# Patient Record
Sex: Female | Born: 1975 | Race: White | Hispanic: No | Marital: Married
Health system: Southern US, Community
[De-identification: ages and names within clinical notes are randomized; demographics above are authoritative.]

## PROBLEM LIST (undated history)

## (undated) ENCOUNTER — Inpatient Hospital Stay (HOSPITAL_COMMUNITY): Payer: Self-pay

## (undated) DIAGNOSIS — O99280 Endocrine, nutritional and metabolic diseases complicating pregnancy, unspecified trimester: Secondary | ICD-10-CM

## (undated) DIAGNOSIS — R768 Other specified abnormal immunological findings in serum: Secondary | ICD-10-CM

## (undated) DIAGNOSIS — O09529 Supervision of elderly multigravida, unspecified trimester: Secondary | ICD-10-CM

## (undated) DIAGNOSIS — E7212 Methylenetetrahydrofolate reductase deficiency: Secondary | ICD-10-CM

## (undated) DIAGNOSIS — Z862 Personal history of diseases of the blood and blood-forming organs and certain disorders involving the immune mechanism: Secondary | ICD-10-CM

## (undated) DIAGNOSIS — Z8619 Personal history of other infectious and parasitic diseases: Secondary | ICD-10-CM

## (undated) DIAGNOSIS — R87629 Unspecified abnormal cytological findings in specimens from vagina: Secondary | ICD-10-CM

## (undated) DIAGNOSIS — Z87448 Personal history of other diseases of urinary system: Secondary | ICD-10-CM

## (undated) HISTORY — DX: Other specified abnormal immunological findings in serum: R76.8

## (undated) HISTORY — PX: COSMETIC SURGERY: SHX468

## (undated) HISTORY — DX: Personal history of other diseases of urinary system: Z87.448

## (undated) HISTORY — DX: Supervision of elderly multigravida, unspecified trimester: O09.529

## (undated) HISTORY — DX: Personal history of other infectious and parasitic diseases: Z86.19

## (undated) HISTORY — DX: Unspecified abnormal cytological findings in specimens from vagina: R87.629

## (undated) HISTORY — PX: COLPOSCOPY: SHX161

## (undated) HISTORY — DX: Personal history of diseases of the blood and blood-forming organs and certain disorders involving the immune mechanism: Z86.2

## (undated) HISTORY — PX: ECTOPIC PREGNANCY SURGERY: SHX613

---

## 1998-05-30 ENCOUNTER — Other Ambulatory Visit: Admission: RE | Admit: 1998-05-30 | Discharge: 1998-05-30 | Payer: Self-pay | Admitting: Obstetrics and Gynecology

## 1999-11-12 ENCOUNTER — Inpatient Hospital Stay (HOSPITAL_COMMUNITY): Admission: AD | Admit: 1999-11-12 | Discharge: 1999-11-12 | Payer: Self-pay | Admitting: *Deleted

## 1999-12-22 ENCOUNTER — Encounter: Payer: Self-pay | Admitting: Obstetrics and Gynecology

## 1999-12-22 ENCOUNTER — Encounter (INDEPENDENT_AMBULATORY_CARE_PROVIDER_SITE_OTHER): Payer: Self-pay | Admitting: *Deleted

## 1999-12-22 ENCOUNTER — Observation Stay (HOSPITAL_COMMUNITY): Admission: AD | Admit: 1999-12-22 | Discharge: 1999-12-22 | Payer: Self-pay | Admitting: Obstetrics and Gynecology

## 2008-02-11 ENCOUNTER — Other Ambulatory Visit: Admission: RE | Admit: 2008-02-11 | Discharge: 2008-02-11 | Payer: Self-pay | Admitting: Family Medicine

## 2008-02-16 ENCOUNTER — Ambulatory Visit: Payer: Self-pay | Admitting: Hematology & Oncology

## 2009-04-26 ENCOUNTER — Ambulatory Visit (HOSPITAL_COMMUNITY): Admission: RE | Admit: 2009-04-26 | Discharge: 2009-04-26 | Payer: Self-pay | Admitting: Obstetrics and Gynecology

## 2009-06-09 ENCOUNTER — Ambulatory Visit (HOSPITAL_COMMUNITY): Admission: RE | Admit: 2009-06-09 | Discharge: 2009-06-09 | Payer: Self-pay | Admitting: Obstetrics and Gynecology

## 2009-07-24 ENCOUNTER — Ambulatory Visit (HOSPITAL_COMMUNITY): Admission: RE | Admit: 2009-07-24 | Discharge: 2009-07-24 | Payer: Self-pay | Admitting: Obstetrics and Gynecology

## 2009-08-14 ENCOUNTER — Ambulatory Visit (HOSPITAL_COMMUNITY): Admission: RE | Admit: 2009-08-14 | Discharge: 2009-08-14 | Payer: Self-pay | Admitting: Obstetrics and Gynecology

## 2009-09-11 ENCOUNTER — Ambulatory Visit (HOSPITAL_COMMUNITY): Admission: RE | Admit: 2009-09-11 | Discharge: 2009-09-11 | Payer: Self-pay | Admitting: Obstetrics and Gynecology

## 2009-10-09 ENCOUNTER — Ambulatory Visit (HOSPITAL_COMMUNITY): Admission: RE | Admit: 2009-10-09 | Discharge: 2009-10-09 | Payer: Self-pay | Admitting: Obstetrics and Gynecology

## 2009-10-27 ENCOUNTER — Inpatient Hospital Stay (HOSPITAL_COMMUNITY): Admission: AD | Admit: 2009-10-27 | Discharge: 2009-10-29 | Payer: Self-pay | Admitting: Obstetrics and Gynecology

## 2011-03-27 LAB — CBC
HCT: 31.1 % — ABNORMAL LOW (ref 36.0–46.0)
HCT: 33.1 % — ABNORMAL LOW (ref 36.0–46.0)
MCV: 92.1 fL (ref 78.0–100.0)
MCV: 92.4 fL (ref 78.0–100.0)
Platelets: 260 10*3/uL (ref 150–400)
Platelets: 281 10*3/uL (ref 150–400)
RDW: 12.9 % (ref 11.5–15.5)
RDW: 13.3 % (ref 11.5–15.5)

## 2011-05-10 NOTE — Letter (Signed)
May 02, 2009    Arlyce Harman, M.D.  [address]  [place]  [place] [address]   RE:   Kemp, Sandra  MR#   [id number]  ACC#        [id number]   MFM CONSULTATION REPORT   Dear Dr. Neva Seat:   Thank you for referring your patient, Sandra Kemp, for maternal fetal  medicine consultation.  As you are aware, [name] is a 35 year old  gravida 5, para 3-0-1-3 at 12 weeks 4 days gestation based on her last  menstrual period and confirmed with early ultrasound.  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   [name] reports undergoing a workup for anticardiolipin antibodies  after she was found to have a false positive RPR with a previous  pregnancy.  She had no history of prior thromboembolic events for  pregnancy outcomes or symptoms of lupus, but given these findings was  treated in subsequent pregnancies with prophylactic dose heparin and  fractionated heparin and low-dose aspirin.  Since her diagnosis, Ms.  Sandra Kemp has followed annually with a rheumatologist.  She reports having  negative anticardiolipin antibody titers 1 year ago.  These were  reevaluated with her current pregnancy, and on April 18, 2009, lupus  anticoagulant, as well as anticardiolipin antibodies, were negative.   [name]' obstetric history includes three prior vaginal deliveries.  Her first pregnancy was delivered at term without complication.  It was  during this gestation that she was diagnosed with anticardiolipin  antibodies.  She subsequently had and an ectopic pregnancy.  She then  had two subsequent gestations, during which she received subcutaneous  injections of heparin and took low-dose aspirin.  One of these was  delivered at 35 weeks due to preeclampsia.  She reports some question  regarding the diagnosis of preeclampsia, but given clinical picture at  that time, delivery was recommended.  She delivered vaginally without  complication.  Subsequent pregnancy  was delivered at 52 weeks'  gestation, and she was told the indication for delivery at that  gestational age was her anticardiolipin antibodies.   [name] has been recommended to take a low-dose aspirin on a  continuous daily basis by her rheumatologist.  She is currently taking  this now and with prenatal vitamins.  She is on no other medications.   [name] GYN history is negative for abnormal Pap smears or STDs.  She  has had no procedures on her cervix or uterus.   [name] has no other significant medical history.  Her only surgery  is breast implants.  She reports no complications with this surgery.  [name] does not use tobacco, alcohol or street drugs.  She has no  significant family history including no thromboembolic conditions,  thromboembolic events or known genetic diseases or congenital anomalies.   [name] was feeling well today and had no specific complaints.  Her  review of systems was negative.   [name] underwent an ultrasound today which revealed a singleton  gestation with a gestational crown-rump length measurement consistent  with her last menstrual period.  The option of first semester screening  was discussed with [name] today, and she declined this.  Normal  fetal heart rate was documented, and no free fluid was seen in the cul-  de-sac.  No adnexal abnormalities were identified.   [name] blood pressure today was 117/67, her pulse was 85 beats per  minute, and her  weight was 146 pounds.  She appeared in no acute  distress, and her abdomen was appropriate without tenderness or masses.   The diagnosis of anticardiolipin antibodies was discussed with Ms.  Hughes at length including the variability in measurements of these  titers over time.  The potential for adverse maternal fetal outcomes  during pregnancy in the setting of anticardiolipin antibodies was also  discussed.  However, given that [name] had negative titers 1 year   ago, as well as negative titers on repeat evaluation during this  pregnancy, the risks for adverse pregnancy outcomes at the current time  are very low.  Additionally, given this, we would not recommend  treatment with heparin at the present time.  Given that her  rheumatologist has recommended long-term therapy with low-dose aspirin,  we have recommended that she continue this through gestation and  continue with her long-term therapy after delivery.  Given that antibody  titers can be variable during gestation, we would recommend repeat  evaluations of these in the second as well as the third trimester.  Recommendations may need to be revised based on the results of this  testing.  Would also recommend continued long-term follow-up with Ms.  [facility] rheumatologist.  Of note, she reports a complete thrombophilia  panel has been drawn in the past and found to be negative.  We would  recommend confirmation of this.   [name] has a history of preeclampsia.  Given this, she is increased  risk for development of preeclampsia with his gestation.  Of course,  close surveillance for development of preeclampsia will be necessary.  Further, we would recommend a baseline 24-hour urine be performed for  evaluation of baseline renal function.  We have scheduled [name] to  return to our office in approximately 6 weeks.  At that time, we will  perform a follow-up ultrasound to evaluate fetal anatomy and will redraw  her anticardiolipin antibodies at that time.   Risks and benefits of heparin therapy, as well as the risks and benefits  of anticardiolipin antibodies were reviewed at length with [name]  today.  Signs and symptoms of thromboembolic conditions were reviewed  and precautions were given.  We look forward to seeing [name] back.  Thank you for allowing [name] to participate in the care of [name].  Please feel free to contact us at any time regarding this or any patient  you may  have.   Of note, we now offer 24-hour direct access to maternal fetal medicine  faculty at [facility].  We can be reached at [phone]  at any time.  Please free to contact us via this number.   Sincerely,      Heather L. Rachel Bo, MD  Maternal Fetal Medicine  [facility]     HLM/MEDQ  D:  05/02/2009  T:  05/02/2009  Job:  [id number]

## 2013-09-09 LAB — OB RESULTS CONSOLE HIV ANTIBODY (ROUTINE TESTING): HIV: NONREACTIVE

## 2013-09-09 LAB — OB RESULTS CONSOLE HEPATITIS B SURFACE ANTIGEN: HEP B S AG: NEGATIVE

## 2013-09-09 LAB — OB RESULTS CONSOLE ABO/RH: RH TYPE: POSITIVE

## 2013-09-09 LAB — OB RESULTS CONSOLE RUBELLA ANTIBODY, IGM: Rubella: IMMUNE

## 2013-09-09 LAB — OB RESULTS CONSOLE ANTIBODY SCREEN: Antibody Screen: NEGATIVE

## 2013-12-23 NOTE — L&D Delivery Note (Signed)
Delivery Note At 3:21 PM a viable female was delivered via  LOA Presentation Apgars 9 9 weight pending  Placenta status:spontaneously, intact with 3 vessel cord , .  Cord:  with the following complications:none  Anesthesia: Epidural  Episiotomy: none Lacerations: none Suture Repair: not applicable Est. Blood Loss (mL): 300  Mom to postpartum.  Baby to Couplet care / Skin to Skin.  Jeani HawkingMichelle L Keajah Killough 04/05/2014, 3:30 PM

## 2014-02-26 ENCOUNTER — Inpatient Hospital Stay (HOSPITAL_COMMUNITY)
Admission: AD | Admit: 2014-02-26 | Discharge: 2014-02-26 | Disposition: A | Discharge status: Home / Self Care | Payer: No Typology Code available for payment source | Source: Ambulatory Visit | Attending: Obstetrics and Gynecology | Admitting: Obstetrics and Gynecology

## 2014-02-26 ENCOUNTER — Encounter (HOSPITAL_COMMUNITY): Payer: Self-pay | Admitting: *Deleted

## 2014-02-26 DIAGNOSIS — O99891 Other specified diseases and conditions complicating pregnancy: Secondary | ICD-10-CM | POA: Insufficient documentation

## 2014-02-26 DIAGNOSIS — M545 Low back pain, unspecified: Secondary | ICD-10-CM

## 2014-02-26 DIAGNOSIS — R109 Unspecified abdominal pain: Secondary | ICD-10-CM | POA: Insufficient documentation

## 2014-02-26 DIAGNOSIS — O9989 Other specified diseases and conditions complicating pregnancy, childbirth and the puerperium: Principal | ICD-10-CM

## 2014-02-26 DIAGNOSIS — R197 Diarrhea, unspecified: Secondary | ICD-10-CM | POA: Insufficient documentation

## 2014-02-26 HISTORY — DX: Methylenetetrahydrofolate reductase deficiency: O99.280

## 2014-02-26 HISTORY — DX: Methylenetetrahydrofolate reductase deficiency: E72.12

## 2014-02-26 LAB — COMPREHENSIVE METABOLIC PANEL
ALBUMIN: 3 g/dL — AB (ref 3.5–5.2)
ALT: 17 U/L (ref 0–35)
AST: 18 U/L (ref 0–37)
Alkaline Phosphatase: 130 U/L — ABNORMAL HIGH (ref 39–117)
BUN: 6 mg/dL (ref 6–23)
CALCIUM: 8.7 mg/dL (ref 8.4–10.5)
CO2: 21 mEq/L (ref 19–32)
Chloride: 102 mEq/L (ref 96–112)
Creatinine, Ser: 0.47 mg/dL — ABNORMAL LOW (ref 0.50–1.10)
GFR calc non Af Amer: >90 mL/min (ref >90)
GLUCOSE: 83 mg/dL (ref 70–99)
Potassium: 3.6 mEq/L — ABNORMAL LOW (ref 3.7–5.3)
SODIUM: 137 meq/L (ref 137–147)
TOTAL PROTEIN: 6.2 g/dL (ref 6.0–8.3)
Total Bilirubin: 0.6 mg/dL (ref 0.3–1.2)

## 2014-02-26 LAB — CBC WITH DIFFERENTIAL/PLATELET
BAND NEUTROPHILS: 0 % (ref 0–10)
BLASTS: 0 %
Basophils Absolute: 0 10*3/uL (ref 0.0–0.1)
Basophils Relative: 0 % (ref 0–1)
EOS ABS: 0.1 10*3/uL (ref 0.0–0.7)
EOS PCT: 1 % (ref 0–5)
HEMATOCRIT: 32.8 % — AB (ref 36.0–46.0)
HEMOGLOBIN: 11.6 g/dL — AB (ref 12.0–15.0)
LYMPHS ABS: 1 10*3/uL (ref 0.7–4.0)
LYMPHS PCT: 16 % (ref 12–46)
MCH: 30.9 pg (ref 26.0–34.0)
MCHC: 35.4 g/dL (ref 30.0–36.0)
MCV: 87.2 fL (ref 78.0–100.0)
METAMYELOCYTES PCT: 0 %
MONO ABS: 0.1 10*3/uL (ref 0.1–1.0)
MONOS PCT: 2 % — AB (ref 3–12)
Myelocytes: 0 %
NRBC: 0 /100{WBCs}
Neutro Abs: 5.1 10*3/uL (ref 1.7–7.7)
Neutrophils Relative %: 81 % — ABNORMAL HIGH (ref 43–77)
Platelets: 211 10*3/uL (ref 150–400)
Promyelocytes Absolute: 0 %
RBC: 3.76 MIL/uL — AB (ref 3.87–5.11)
RDW: 13 % (ref 11.5–15.5)
WBC: 6.3 10*3/uL (ref 4.0–10.5)

## 2014-02-26 LAB — URINALYSIS, ROUTINE W REFLEX MICROSCOPIC
Bilirubin Urine: NEGATIVE
GLUCOSE, UA: NEGATIVE mg/dL
HGB URINE DIPSTICK: NEGATIVE
Ketones, ur: 15 mg/dL — AB
Nitrite: NEGATIVE
PH: 6 (ref 5.0–8.0)
PROTEIN: NEGATIVE mg/dL
SPECIFIC GRAVITY, URINE: 1.01 (ref 1.005–1.030)
Urobilinogen, UA: 0.2 mg/dL (ref 0.0–1.0)

## 2014-02-26 LAB — URINE MICROSCOPIC-ADD ON

## 2014-02-26 MED ORDER — OXYCODONE-ACETAMINOPHEN 5-325 MG PO TABS
1.0000 | ORAL_TABLET | Freq: Once | ORAL | Status: AC
  Administered 2014-02-26: 1 via ORAL
  Filled 2014-02-26: qty 1

## 2014-02-26 MED ORDER — CYCLOBENZAPRINE HCL 10 MG PO TABS: 10.0000 mg | ORAL_TABLET | Freq: Three times a day (TID) | ORAL | Status: DC | PRN

## 2014-02-26 MED ORDER — OXYCODONE-ACETAMINOPHEN 5-325 MG PO TABS: 1.0000 | ORAL_TABLET | Freq: Four times a day (QID) | ORAL | Status: DC | PRN

## 2014-02-26 MED ORDER — CYCLOBENZAPRINE HCL 10 MG PO TABS
10.0000 mg | ORAL_TABLET | Freq: Once | ORAL | Status: AC
  Administered 2014-02-26: 10 mg via ORAL
  Filled 2014-02-26: qty 1

## 2014-02-26 NOTE — MAU Note (Signed)
Pt reports pain in since 3am. Pt states that the pain is mostly on the L side of her back and wraps around into her abdomen. Pt states that she has had chills, no appetite and just feels sick.

## 2014-02-26 NOTE — Discharge Instructions (Signed)
Abdominal Pain During Pregnancy °Abdominal pain is common in pregnancy. Most of the time, it does not cause harm. There are many causes of abdominal pain. Some causes are more serious than others. Some of the causes of abdominal pain in pregnancy are easily diagnosed. Occasionally, the diagnosis takes time to understand. Other times, the cause is not determined. Abdominal pain can be a sign that something is very wrong with the pregnancy, or the pain may have nothing to do with the pregnancy at all. For this reason, always tell your health care provider if you have any abdominal discomfort. °HOME CARE INSTRUCTIONS  °Monitor your abdominal pain for any changes. The following actions may help to alleviate any discomfort you are experiencing: °· Do not have sexual intercourse or put anything in your vagina until your symptoms go away completely. °· Get plenty of rest until your pain improves. °· Drink clear fluids if you feel nauseous. Avoid solid food as long as you are uncomfortable or nauseous. °· Only take over-the-counter or prescription medicine as directed by your health care provider. °· Keep all follow-up appointments with your health care provider. °SEEK IMMEDIATE MEDICAL CARE IF: °· You are bleeding, leaking fluid, or passing tissue from the vagina. °· You have increasing pain or cramping. °· You have persistent vomiting. °· You have painful or bloody urination. °· You have a fever. °· You notice a decrease in your baby's movements. °· You have extreme weakness or feel faint. °· You have shortness of breath, with or without abdominal pain. °· You develop a severe headache with abdominal pain. °· You have abnormal vaginal discharge with abdominal pain. °· You have persistent diarrhea. °· You have abdominal pain that continues even after rest, or gets worse. °MAKE SURE YOU:  °· Understand these instructions. °· Will watch your condition. °· Will get help right away if you are not doing well or get  worse. °Document Released: 12/09/2005 Document Revised: 09/29/2013 Document Reviewed: 07/08/2013 °ExitCare® Patient Information ©2014 [employer]. ° °

## 2014-02-26 NOTE — MAU Note (Deleted)
Pt reports her water broke 1hr ago. Clear fluid. Clear fluid observedd leaking out at this time. Pt uncomfortable SVE 4-5/70-0

## 2014-02-26 NOTE — MAU Provider Note (Signed)
  History     CSN: [id number]  Arrival date and time: 02/26/14 [id number]   First Provider Initiated Contact with Patient 02/26/14 1849      Chief Complaint  Patient presents with  . Back Pain  . Abdominal Cramping   Back Pain  Abdominal Cramping    38 yo [id number] @ [id number] here for left sided flank pain wrapping around to her abdomen.  States that she woke up at 3am with this burning and sharp pain in the left lower back.  Feels like it wraps on her left side around to the front but stays only on that left side.  Has also had nausea and 3 episodes of diarrhea.  No vomiting, fevers, chills, dysuria, hematuria, increased frequency.     No sob, cp.     OB History   Grav Para Term Preterm Abortions TAB SAB Ect Mult Living   7 4 3 1 2  1 1  2     G1- NSVD, preterm @35  weeks induction for pre-E G2-G4- term, NSVD G6- SAB G7- current  Past Medical History  Diagnosis Date  . MTHFR deficiency complicating pregnancy     Past Surgical History  Procedure Laterality Date  . Ectopic pregnancy surgery      Family History  Problem Relation Age of Onset  . Diabetes Father     History  Substance Use Topics  . Smoking status: Never Smoker   . Smokeless tobacco: Not on file  . Alcohol Use: No    Allergies: No Known Allergies  Prescriptions prior to admission  Medication Sig Dispense Refill  . aspirin 81 MG chewable tablet Chew by mouth daily.      . folic acid (FOLVITE) 400 MCG tablet Take 400 mcg by mouth daily.        Review of Systems  Musculoskeletal: Positive for back pain.  - see above Physical Exam   Blood pressure 124/71, pulse 99, temperature 98.2 F (36.8 C), temperature source Oral, resp. rate 16.  Physical Exam  Constitutional: She is oriented to person, place, and time. She appears well-developed and well-nourished.  Neck: Neck supple.  Cardiovascular: Normal rate, regular rhythm and normal heart sounds.   No murmur heard. Respiratory: Effort normal and  breath sounds normal. No respiratory distress.  GI: Soft. Bowel sounds are normal. She exhibits no distension and no mass. There is tenderness (mild tenderness at the epigastrium ). There is no rebound and no guarding.  Musculoskeletal: Normal range of motion.  No CVA tenderness.  Mild ttp along the paraspinals on the left.   Neurological: She is alert and oriented to person, place, and time.  Skin: Skin is warm and dry. No rash noted.    MAU Course  Procedures  MDM Unclear etiology. Exam benign but pain apperas somewhat severe initially.  Ddx includes viral illness vs. msk etiology vs. Nephrolithiasis Cbc and cmp unremarkable.  Given percocet x 1 here with marked improvement in her pain making nephrolithiasis less likely.    Assessment and Plan    - no red flag symptoms and exam benign.  - will send home with percocet and flexeril -discussed leading diagnoses with pt as well - discussed with Dr. Rana SnareLowe who was in agreement - will have pt f/u in the office on Tuesday as scheduled. - return precautions including worsening pain, change in symptoms, or other changes discussed.   Tuvia Woodrick L 02/26/2014, 7:01 PM

## 2014-02-28 LAB — URINE CULTURE
Colony Count: NO GROWTH
Culture: NO GROWTH
Special Requests: NORMAL

## 2014-03-31 ENCOUNTER — Telehealth (HOSPITAL_COMMUNITY): Payer: Self-pay | Admitting: *Deleted

## 2014-03-31 ENCOUNTER — Encounter (HOSPITAL_COMMUNITY): Payer: Self-pay | Admitting: *Deleted

## 2014-03-31 LAB — OB RESULTS CONSOLE GBS: STREP GROUP B AG: POSITIVE

## 2014-03-31 NOTE — Telephone Encounter (Signed)
Preadmission screen  

## 2014-04-01 ENCOUNTER — Encounter (HOSPITAL_COMMUNITY): Payer: Self-pay | Admitting: *Deleted

## 2014-04-01 ENCOUNTER — Telehealth (HOSPITAL_COMMUNITY): Payer: Self-pay | Admitting: *Deleted

## 2014-04-01 NOTE — Telephone Encounter (Signed)
Preadmission screen  

## 2014-04-05 ENCOUNTER — Inpatient Hospital Stay (HOSPITAL_COMMUNITY): Payer: No Typology Code available for payment source | Admitting: Anesthesiology

## 2014-04-05 ENCOUNTER — Encounter (HOSPITAL_COMMUNITY): Payer: No Typology Code available for payment source | Admitting: Anesthesiology

## 2014-04-05 ENCOUNTER — Encounter (HOSPITAL_COMMUNITY): Payer: Self-pay

## 2014-04-05 ENCOUNTER — Inpatient Hospital Stay (HOSPITAL_COMMUNITY)
Admission: RE | Admit: 2014-04-05 | Discharge: 2014-04-06 | DRG: 775 | Disposition: A | Discharge status: Home / Self Care | Payer: No Typology Code available for payment source | Source: Ambulatory Visit | Attending: Obstetrics and Gynecology | Admitting: Obstetrics and Gynecology

## 2014-04-05 DIAGNOSIS — Z833 Family history of diabetes mellitus: Secondary | ICD-10-CM

## 2014-04-05 DIAGNOSIS — E7210 Disorders of sulfur-bearing amino-acid metabolism, unspecified: Secondary | ICD-10-CM | POA: Diagnosis present

## 2014-04-05 DIAGNOSIS — Z3490 Encounter for supervision of normal pregnancy, unspecified, unspecified trimester: Secondary | ICD-10-CM

## 2014-04-05 DIAGNOSIS — O09529 Supervision of elderly multigravida, unspecified trimester: Secondary | ICD-10-CM | POA: Diagnosis present

## 2014-04-05 DIAGNOSIS — O99892 Other specified diseases and conditions complicating childbirth: Principal | ICD-10-CM | POA: Diagnosis present

## 2014-04-05 DIAGNOSIS — O9989 Other specified diseases and conditions complicating pregnancy, childbirth and the puerperium: Principal | ICD-10-CM

## 2014-04-05 DIAGNOSIS — Z8249 Family history of ischemic heart disease and other diseases of the circulatory system: Secondary | ICD-10-CM

## 2014-04-05 DIAGNOSIS — Z22330 Carrier of Group B streptococcus: Secondary | ICD-10-CM

## 2014-04-05 LAB — CBC
HCT: 34 % — ABNORMAL LOW (ref 36.0–46.0)
Hemoglobin: 11.9 g/dL — ABNORMAL LOW (ref 12.0–15.0)
MCH: 31.1 pg (ref 26.0–34.0)
MCHC: 35 g/dL (ref 30.0–36.0)
MCV: 88.8 fL (ref 78.0–100.0)
PLATELETS: 262 10*3/uL (ref 150–400)
RBC: 3.83 MIL/uL — ABNORMAL LOW (ref 3.87–5.11)
RDW: 13.3 % (ref 11.5–15.5)
WBC: 7.2 10*3/uL (ref 4.0–10.5)

## 2014-04-05 LAB — RPR: RPR: REACTIVE — AB

## 2014-04-05 LAB — RPR TITER: RPR Titer: 1:4 {titer} — AB

## 2014-04-05 LAB — TYPE AND SCREEN
ABO/RH(D): A POS
Antibody Screen: NEGATIVE

## 2014-04-05 LAB — ABO/RH: ABO/RH(D): A POS

## 2014-04-05 MED ORDER — DEXTROSE 5 % IV SOLN
5.0000 10*6.[IU] | Freq: Once | INTRAVENOUS | Status: AC
  Administered 2014-04-05: 5 10*6.[IU] via INTRAVENOUS
  Filled 2014-04-05: qty 5

## 2014-04-05 MED ORDER — TETANUS-DIPHTH-ACELL PERTUSSIS 5-2.5-18.5 LF-MCG/0.5 IM SUSP: 0.5000 mL | Freq: Once | INTRAMUSCULAR | Status: DC

## 2014-04-05 MED ORDER — PHENYLEPHRINE 40 MCG/ML (10ML) SYRINGE FOR IV PUSH (FOR BLOOD PRESSURE SUPPORT)
80.0000 ug | PREFILLED_SYRINGE | INTRAVENOUS | Status: DC | PRN
  Filled 2014-04-05: qty 2
  Filled 2014-04-05: qty 10

## 2014-04-05 MED ORDER — LACTATED RINGERS IV SOLN
INTRAVENOUS | Status: DC
  Administered 2014-04-05 (×2): via INTRAVENOUS

## 2014-04-05 MED ORDER — BENZOCAINE-MENTHOL 20-0.5 % EX AERO: 1.0000 "application " | INHALATION_SPRAY | CUTANEOUS | Status: DC | PRN

## 2014-04-05 MED ORDER — LIDOCAINE HCL (PF) 1 % IJ SOLN
INTRAMUSCULAR | Status: DC | PRN
  Administered 2014-04-05 (×2): 5 mL

## 2014-04-05 MED ORDER — MEASLES, MUMPS & RUBELLA VAC ~~LOC~~ INJ: 0.5000 mL | INJECTION | Freq: Once | SUBCUTANEOUS | Status: DC

## 2014-04-05 MED ORDER — LACTATED RINGERS IV SOLN: 500.0000 mL | Freq: Once | INTRAVENOUS | Status: DC

## 2014-04-05 MED ORDER — ONDANSETRON HCL 4 MG PO TABS: 4.0000 mg | ORAL_TABLET | ORAL | Status: DC | PRN

## 2014-04-05 MED ORDER — ONDANSETRON HCL 4 MG/2ML IJ SOLN: 4.0000 mg | Freq: Four times a day (QID) | INTRAMUSCULAR | Status: DC | PRN

## 2014-04-05 MED ORDER — EPHEDRINE 5 MG/ML INJ
10.0000 mg | INTRAVENOUS | Status: DC | PRN
  Filled 2014-04-05: qty 4
  Filled 2014-04-05: qty 2

## 2014-04-05 MED ORDER — DIPHENHYDRAMINE HCL 50 MG/ML IJ SOLN: 12.5000 mg | INTRAMUSCULAR | Status: DC | PRN

## 2014-04-05 MED ORDER — CITRIC ACID-SODIUM CITRATE 334-500 MG/5ML PO SOLN: 30.0000 mL | ORAL | Status: DC | PRN

## 2014-04-05 MED ORDER — LIDOCAINE HCL (PF) 1 % IJ SOLN
30.0000 mL | INTRAMUSCULAR | Status: DC | PRN
  Filled 2014-04-05: qty 30

## 2014-04-05 MED ORDER — LACTATED RINGERS IV SOLN
500.0000 mL | INTRAVENOUS | Status: DC | PRN
Start: 2014-04-05 — End: 2014-04-05

## 2014-04-05 MED ORDER — SENNOSIDES-DOCUSATE SODIUM 8.6-50 MG PO TABS
2.0000 | ORAL_TABLET | ORAL | Status: DC
  Administered 2014-04-06: 2 via ORAL
  Filled 2014-04-05: qty 2

## 2014-04-05 MED ORDER — PRENATAL MULTIVITAMIN CH
1.0000 | ORAL_TABLET | Freq: Every day | ORAL | Status: DC
  Administered 2014-04-06: 1 via ORAL
  Filled 2014-04-05: qty 1

## 2014-04-05 MED ORDER — WITCH HAZEL-GLYCERIN EX PADS: 1.0000 "application " | MEDICATED_PAD | CUTANEOUS | Status: DC | PRN

## 2014-04-05 MED ORDER — PENICILLIN G POTASSIUM 5000000 UNITS IJ SOLR
2.5000 10*6.[IU] | INTRAMUSCULAR | Status: DC
  Administered 2014-04-05: 2.5 10*6.[IU] via INTRAVENOUS
  Filled 2014-04-05 (×5): qty 2.5

## 2014-04-05 MED ORDER — OXYCODONE-ACETAMINOPHEN 5-325 MG PO TABS
1.0000 | ORAL_TABLET | ORAL | Status: DC | PRN
  Administered 2014-04-05: 1 via ORAL
  Administered 2014-04-06 (×4): 2 via ORAL
  Filled 2014-04-05: qty 2
  Filled 2014-04-05: qty 1
  Filled 2014-04-05 (×3): qty 2

## 2014-04-05 MED ORDER — IBUPROFEN 600 MG PO TABS
600.0000 mg | ORAL_TABLET | Freq: Four times a day (QID) | ORAL | Status: DC
  Administered 2014-04-05 – 2014-04-06 (×4): 600 mg via ORAL
  Filled 2014-04-05 (×4): qty 1

## 2014-04-05 MED ORDER — EPHEDRINE 5 MG/ML INJ
10.0000 mg | INTRAVENOUS | Status: DC | PRN
  Filled 2014-04-05: qty 2

## 2014-04-05 MED ORDER — LANOLIN HYDROUS EX OINT: TOPICAL_OINTMENT | CUTANEOUS | Status: DC | PRN

## 2014-04-05 MED ORDER — FENTANYL 2.5 MCG/ML BUPIVACAINE 1/10 % EPIDURAL INFUSION (WH - ANES)
14.0000 mL/h | INTRAMUSCULAR | Status: DC | PRN
Start: 2014-04-05 — End: 2014-04-05
  Administered 2014-04-05: 14 mL/h via EPIDURAL
  Filled 2014-04-05: qty 125

## 2014-04-05 MED ORDER — MEDROXYPROGESTERONE ACETATE 150 MG/ML IM SUSP: 150.0000 mg | INTRAMUSCULAR | Status: DC | PRN

## 2014-04-05 MED ORDER — IBUPROFEN 600 MG PO TABS: 600.0000 mg | ORAL_TABLET | Freq: Four times a day (QID) | ORAL | Status: DC | PRN

## 2014-04-05 MED ORDER — FLEET ENEMA 7-19 GM/118ML RE ENEM: 1.0000 | ENEMA | Freq: Every day | RECTAL | Status: DC | PRN

## 2014-04-05 MED ORDER — OXYCODONE-ACETAMINOPHEN 5-325 MG PO TABS
1.0000 | ORAL_TABLET | ORAL | Status: DC | PRN
  Administered 2014-04-05: 1 via ORAL
  Filled 2014-04-05: qty 1

## 2014-04-05 MED ORDER — OXYTOCIN 40 UNITS IN LACTATED RINGERS INFUSION - SIMPLE MED
62.5000 mL/h | INTRAVENOUS | Status: DC
  Administered 2014-04-05: 62.5 mL/h via INTRAVENOUS

## 2014-04-05 MED ORDER — OXYTOCIN BOLUS FROM INFUSION: 500.0000 mL | INTRAVENOUS | Status: DC

## 2014-04-05 MED ORDER — OXYTOCIN 40 UNITS IN LACTATED RINGERS INFUSION - SIMPLE MED
1.0000 m[IU]/min | INTRAVENOUS | Status: DC
  Administered 2014-04-05: 1 m[IU]/min via INTRAVENOUS
  Filled 2014-04-05: qty 1000

## 2014-04-05 MED ORDER — PHENYLEPHRINE 40 MCG/ML (10ML) SYRINGE FOR IV PUSH (FOR BLOOD PRESSURE SUPPORT)
80.0000 ug | PREFILLED_SYRINGE | INTRAVENOUS | Status: DC | PRN
  Filled 2014-04-05: qty 2

## 2014-04-05 MED ORDER — BISACODYL 10 MG RE SUPP
10.0000 mg | Freq: Every day | RECTAL | Status: DC | PRN
Start: 2014-04-05 — End: 2014-04-06

## 2014-04-05 MED ORDER — ACETAMINOPHEN 325 MG PO TABS: 650.0000 mg | ORAL_TABLET | ORAL | Status: DC | PRN

## 2014-04-05 MED ORDER — TERBUTALINE SULFATE 1 MG/ML IJ SOLN: 0.2500 mg | Freq: Once | INTRAMUSCULAR | Status: DC | PRN

## 2014-04-05 MED ORDER — DIBUCAINE 1 % RE OINT: 1.0000 "application " | TOPICAL_OINTMENT | RECTAL | Status: DC | PRN

## 2014-04-05 MED ORDER — DIPHENHYDRAMINE HCL 25 MG PO CAPS: 25.0000 mg | ORAL_CAPSULE | Freq: Four times a day (QID) | ORAL | Status: DC | PRN

## 2014-04-05 MED ORDER — SIMETHICONE 80 MG PO CHEW: 80.0000 mg | CHEWABLE_TABLET | ORAL | Status: DC | PRN

## 2014-04-05 MED ORDER — ONDANSETRON HCL 4 MG/2ML IJ SOLN
4.0000 mg | INTRAMUSCULAR | Status: DC | PRN
Start: 2014-04-05 — End: 2014-04-06

## 2014-04-05 MED ORDER — ZOLPIDEM TARTRATE 5 MG PO TABS: 5.0000 mg | ORAL_TABLET | Freq: Every evening | ORAL | Status: DC | PRN

## 2014-04-05 NOTE — H&P (Signed)
Sandra Kemp is a 38 y.o. female presenting for induction of labor. She has history of positive ANA. Induction per patient - history of rapid labor and positive GBBS . Maternal Medical History:  Contractions: Frequency: irregular.   Perceived severity is mild.    Fetal activity: Perceived fetal activity is normal.      OB History   Grav Para Term Preterm Abortions TAB SAB Ect Mult Living   7 4 3 1 2  1 1  4      Past Medical History  Diagnosis Date  . MTHFR deficiency complicating pregnancy   . ANA positive   . Vaginal Pap smear, abnormal   . Hx of varicella   . Hx of pyelonephritis   . AMA (advanced maternal age) multigravida 35+    Past Surgical History  Procedure Laterality Date  . Ectopic pregnancy surgery    . Colposcopy     Family History: family history includes Diabetes in her father; Hypertension in her father. Social History:  reports that she has never smoked. She has never used smokeless tobacco. She reports that she does not drink alcohol or use illicit drugs.   Prenatal Transfer Tool  Maternal Diabetes: No Genetic Screening: Normal Maternal Ultrasounds/Referrals: Normal Fetal Ultrasounds or other Referrals:  None Maternal Substance Abuse:  No Significant Maternal Medications:  None Significant Maternal Lab Results:  None Other Comments:  None  Review of Systems  All other systems reviewed and are negative.   Dilation: 3 Effacement (%): Thick Station: -2 Exam by:: Yehoshua Vitelli Blood pressure 119/78, pulse 111, temperature 98.2 F (36.8 C), temperature source Oral, resp. rate 20, height 5\' 11"  (1.803 m), weight 77.111 kg (170 lb). Maternal Exam:  Uterine Assessment: Contraction strength is mild.  Contraction frequency is irregular.   Abdomen: Fetal presentation: vertex  Introitus: Normal vulva. Normal vagina.    Fetal Exam Fetal State Assessment: Category I - tracings are normal.     Physical Exam  Nursing note and vitals  reviewed. Constitutional: She appears well-developed.  HENT:  Head: Normocephalic.  Eyes: Pupils are equal, round, and reactive to light.  Cardiovascular: Normal rate.   Respiratory: Effort normal.  GI: Soft.    Prenatal labs: ABO, Rh: A/Positive/-- (09/18 0000) Antibody: Negative (09/18 0000) Rubella: Immune (09/18 0000) RPR:    HBsAg: Negative (09/18 0000)  HIV: Non-reactive (09/18 0000)  GBS: Positive (04/09 0000)   Assessment/Plan: IUP at term Favorable cervix  Positive GBBS  Start penicillin Possible Pitocin Risks reviewed Epidural ok   Sandra Kemp 04/05/2014, 8:14 AM

## 2014-04-05 NOTE — Anesthesia Procedure Notes (Signed)
Epidural Patient location during procedure: OB Start time: 04/05/2014 10:24 AM  Staffing Anesthesiologist: Brayton CavesJACKSON, Barron Vanloan Performed by: anesthesiologist   Preanesthetic Checklist Completed: patient identified, site marked, surgical consent, pre-op evaluation, timeout performed, IV checked, risks and benefits discussed and monitors and equipment checked  Epidural Patient position: sitting Prep: site prepped and draped and DuraPrep Patient monitoring: continuous pulse ox and blood pressure Approach: midline Injection technique: LOR air  Needle:  Needle type: Tuohy  Needle gauge: 17 G Needle length: 9 cm and 9 Needle insertion depth: 4 cm Catheter type: closed end flexible Catheter size: 19 Gauge Catheter at skin depth: 10 cm Test dose: negative  Assessment Events: blood not aspirated, injection not painful, no injection resistance, negative IV test and no paresthesia  Additional Notes Patient identified.  Risk benefits discussed including failed block, incomplete pain control, headache, nerve damage, paralysis, blood pressure changes, nausea, vomiting, reactions to medication both toxic or allergic, and postpartum back pain.  Patient expressed understanding and wished to proceed.  All questions were answered.  Sterile technique used throughout procedure and epidural site dressed with sterile barrier dressing. No paresthesia or other complications noted.The patient did not experience any signs of intravascular injection such as tinnitus or metallic taste in mouth nor signs of intrathecal spread such as rapid motor block. Please see nursing notes for vital signs.

## 2014-04-05 NOTE — Progress Notes (Signed)
CRITICAL VALUE ALERT  Critical value received:  RPR reactive Titer 1.4  Date of notification: 04/05/14  Time of notification:  1515  Critical value read back:yes  Nurse who received alert:  L. Cresenzo,RN  MD notified (1st page):  Dr Vincente PoliGrewal   Time of first page:  1515 at bedside  MD notified (2nd page):  Time of second page:  Responding MD:  Dr Vincente PoliGrewal  Time MD responded:  [phone]

## 2014-04-05 NOTE — Anesthesia Preprocedure Evaluation (Addendum)
Anesthesia Evaluation  Patient identified by MRN, date of birth, ID band Patient awake    Reviewed: Allergy & Precautions, H&P , Patient's Chart, lab work & pertinent test results  Airway Mallampati: II TM Distance: >3 FB Neck ROM: full    Dental   Pulmonary  breath sounds clear to auscultation        Cardiovascular Rhythm:regular Rate:Normal     Neuro/Psych    GI/Hepatic   Endo/Other    Renal/GU      Musculoskeletal   Abdominal   Peds  Hematology   Anesthesia Other Findings ANA positive MTHFR deficiency  Reproductive/Obstetrics (+) Pregnancy                           Anesthesia Physical Anesthesia Plan  ASA: II  Anesthesia Plan: Epidural   Post-op Pain Management:    Induction:   Airway Management Planned:   Additional Equipment:   Intra-op Plan:   Post-operative Plan:   Informed Consent: I have reviewed the patients History and Physical, chart, labs and discussed the procedure including the risks, benefits and alternatives for the proposed anesthesia with the patient or authorized representative who has indicated his/her understanding and acceptance.     Plan Discussed with:   Anesthesia Plan Comments:        Anesthesia Quick Evaluation

## 2014-04-06 LAB — RUBELLA SCREEN: RUBELLA: 1.32 {index} — AB (ref <0.90)

## 2014-04-06 LAB — CBC
HCT: 28.2 % — ABNORMAL LOW (ref 36.0–46.0)
Hemoglobin: 9.6 g/dL — ABNORMAL LOW (ref 12.0–15.0)
MCH: 30.4 pg (ref 26.0–34.0)
MCHC: 34 g/dL (ref 30.0–36.0)
MCV: 89.2 fL (ref 78.0–100.0)
Platelets: 184 10*3/uL (ref 150–400)
RBC: 3.16 MIL/uL — ABNORMAL LOW (ref 3.87–5.11)
RDW: 13.2 % (ref 11.5–15.5)
WBC: 9.3 10*3/uL (ref 4.0–10.5)

## 2014-04-06 LAB — T.PALLIDUM AB, IGG: T pallidum Antibodies (TP-PA): 0.14 S/CO (ref <0.90)

## 2014-04-06 MED ORDER — IBUPROFEN 600 MG PO TABS: 600.0000 mg | ORAL_TABLET | Freq: Four times a day (QID) | ORAL | Status: DC

## 2014-04-06 MED ORDER — OXYCODONE-ACETAMINOPHEN 5-325 MG PO TABS: 1.0000 | ORAL_TABLET | ORAL | Status: DC | PRN

## 2014-04-06 NOTE — Discharge Summary (Signed)
Obstetric Discharge Summary Reason for Admission: induction of labor Prenatal Procedures: ultrasound Intrapartum Procedures: spontaneous vaginal delivery Postpartum Procedures: none Complications-Operative and Postpartum: none Hemoglobin  Date Value Ref Range Status  04/06/2014 9.6* 12.0 - 15.0 g/dL Final     DELTA CHECK NOTED     REPEATED TO VERIFY     HCT  Date Value Ref Range Status  04/06/2014 28.2* 36.0 - 46.0 % Final    Physical Exam:  General: alert and cooperative Lochia: appropriate Uterine Fundus: firm Incision: perineum intact DVT Evaluation: No evidence of DVT seen on physical exam. Negative Homan's sign. No cords or calf tenderness. No significant calf/ankle edema.  Discharge Diagnoses: Term Pregnancy-delivered  Discharge Information: Date: 04/06/2014 Activity: pelvic rest Diet: routine Medications: PNV, Ibuprofen and Percocet Condition: stable Instructions: refer to practice specific booklet Discharge to: home   Newborn Data: Live born female  Birth Weight: 6 lb 2.9 oz (2805 g) APGAR: 9, 9  Home with mother.  Judith BlonderCarol G Shailee Foots 04/06/2014, 8:15 AM

## 2014-04-06 NOTE — Anesthesia Postprocedure Evaluation (Signed)
Anesthesia Post Note  Patient: Sandra Kemp  Procedure(s) Performed: * No procedures listed *  Anesthesia type: Epidural  Patient location: Mother/Baby  Post pain: Pain level controlled  Post assessment: Post-op Vital signs reviewed  Last Vitals:  Filed Vitals:   04/06/14 0610  BP: 124/80  Pulse: 77  Temp: 36.4 C  Resp: 20    Post vital signs: Reviewed  Level of consciousness: awake  Complications: No apparent anesthesia complications

## 2014-04-06 NOTE — Lactation Note (Addendum)
This note was copied from the chart of Sandra Mliss Saxnna Ethridge. Lactation Consultation Note Initial consult: P5, States BF for 1-2 weeks with each child. Hand expression taught to Mom.  Mother placed baby in football hold.  Demonstrated breast compression to achieve a deep latch. Mom reports increased comfort.  Baby is nursing very well. Mom encouraged to waken baby for feeds q3. Specifics provided regarding small baby feeding patterns.  Reviewed basics.  Mom made aware of O/P services, breastfeeding support groups, community resources, and our phone # for post-discharge questions.  Encouraged mother to post pump and give back baby volume.  Mother is Engineer, structuralcalling insurance company to get DEBP. Mother is [facility].  Encouraged a LC outpt appt with them.     Patient Name: Sandra Kemp Reason for consult: Initial assessment   Maternal Data Infant to breast within first hour of birth: Yes Has patient been taught Hand Expression?: Yes Does the patient have breastfeeding experience prior to this delivery?: Yes  Feeding Feeding Type: Breast Fed  LATCH Score/Interventions Latch: Grasps breast easily, tongue down, lips flanged, rhythmical sucking.  Audible Swallowing: A few with stimulation  Type of Nipple: Everted at rest and after stimulation  Comfort (Breast/Nipple): Soft / non-tender     Hold (Positioning): Assistance needed to correctly position infant at breast and maintain latch.  LATCH Score: 8  Lactation Tools Discussed/Used     Consult Status Consult Status: Follow-up Date: 04/07/14 Follow-up type: In-patient    Sandra Kemp Kemp, 11:38 AM

## 2014-10-24 ENCOUNTER — Encounter (HOSPITAL_COMMUNITY): Payer: Self-pay

## 2016-04-05 ENCOUNTER — Ambulatory Visit (INDEPENDENT_AMBULATORY_CARE_PROVIDER_SITE_OTHER): Payer: No Typology Code available for payment source | Admitting: Family Medicine

## 2016-04-05 ENCOUNTER — Other Ambulatory Visit: Payer: Self-pay | Admitting: Family Medicine

## 2016-04-05 VITALS — BP 110/70 | HR 84 | Temp 97.5°F | Resp 17 | Ht 70.0 in | Wt 133.0 lb

## 2016-04-05 DIAGNOSIS — N3001 Acute cystitis with hematuria: Secondary | ICD-10-CM | POA: Diagnosis not present

## 2016-04-05 DIAGNOSIS — Z209 Contact with and (suspected) exposure to unspecified communicable disease: Secondary | ICD-10-CM | POA: Diagnosis not present

## 2016-04-05 DIAGNOSIS — J029 Acute pharyngitis, unspecified: Secondary | ICD-10-CM | POA: Diagnosis not present

## 2016-04-05 DIAGNOSIS — R1032 Left lower quadrant pain: Secondary | ICD-10-CM

## 2016-04-05 LAB — POCT CBC
GRANULOCYTE PERCENT: 68 % (ref 37–80)
HEMATOCRIT: 41.1 % (ref 37.7–47.9)
Hemoglobin: 14.3 g/dL (ref 12.2–16.2)
Lymph, poc: 2.1 (ref 0.6–3.4)
MCH: 30.6 pg (ref 27–31.2)
MCHC: 34.7 g/dL (ref 31.8–35.4)
MCV: 88.1 fL (ref 80–97)
MID (CBC): 0.4 (ref 0–0.9)
MPV: 7.2 fL (ref 0–99.8)
POC GRANULOCYTE: 5.4 (ref 2–6.9)
POC LYMPH %: 26.5 % (ref 10–50)
POC MID %: 5.5 %M (ref 0–12)
Platelet Count, POC: 312 10*3/uL (ref 142–424)
RBC: 4.66 M/uL (ref 4.04–5.48)
RDW, POC: 12.7 %
WBC: 7.9 10*3/uL (ref 4.6–10.2)

## 2016-04-05 LAB — POCT URINALYSIS DIP (MANUAL ENTRY)
BILIRUBIN UA: NEGATIVE
Bilirubin, UA: NEGATIVE
Glucose, UA: NEGATIVE
Nitrite, UA: POSITIVE — AB
Protein Ur, POC: NEGATIVE
SPEC GRAV UA: 1.01
Urobilinogen, UA: 0.2
pH, UA: 6

## 2016-04-05 LAB — POC MICROSCOPIC URINALYSIS (UMFC)

## 2016-04-05 MED ORDER — PHENAZOPYRIDINE HCL 200 MG PO TABS: 200.0000 mg | ORAL_TABLET | Freq: Three times a day (TID) | ORAL | Status: DC | PRN

## 2016-04-05 MED ORDER — SULFAMETHOXAZOLE-TRIMETHOPRIM 800-160 MG PO TABS: 1.0000 | ORAL_TABLET | Freq: Two times a day (BID) | ORAL | Status: DC

## 2016-04-05 MED ORDER — HYDROCODONE-ACETAMINOPHEN 5-325 MG PO TABS: 1.0000 | ORAL_TABLET | Freq: Four times a day (QID) | ORAL | Status: DC | PRN

## 2016-04-05 NOTE — Patient Instructions (Addendum)
   IF you received an x-ray today, you will receive an invoice from [facility]. Please contact [facility] at [phone] with questions or concerns regarding your invoice.   IF you received labwork today, you will receive an invoice from [facility]. Please contact [facility] at [phone] with questions or concerns regarding your invoice.   Our billing staff will not be able to assist you with questions regarding bills from these companies.  You will be contacted with the lab results as soon as they are available. The fastest way to get your results is to activate your My Chart account. Instructions are located on the last page of this paperwork. If you have not heard from us regarding the results in 2 weeks, please contact this office.      Pyelonephritis, Adult Pyelonephritis is a kidney infection. The kidneys are the organs that filter a person's blood and move waste out of the bloodstream and into the urine. Urine passes from the kidneys, through the ureters, and into the bladder. There are two main types of pyelonephritis:  Infections that come on quickly without any warning (acute pyelonephritis).  Infections that last for a long period of time (chronic pyelonephritis). In most cases, the infection clears up with treatment and does not cause further problems. More severe infections or chronic infections can sometimes spread to the bloodstream or lead to other problems with the kidneys. CAUSES This condition is usually caused by:  Bacteria traveling from the bladder to the kidney through infected urine. The urine in the bladder can become infected with bacteria from:  Bladder infection (cystitis).  Inflammation of the prostate gland (prostatitis).  Sexual intercourse, in females.  Bacteria traveling from the bloodstream to the kidney. RISK FACTORS This condition is more likely to develop in:  Pregnant women.  Older  people.  People who have diabetes.  People who have kidney stones or bladder stones.  People who have other abnormalities of the kidney or ureter.  People who have a catheter placed in the bladder.  People who have cancer.  People who are sexually active.  Women who use spermicides.  People who have had a prior urinary tract infection. SYMPTOMS Symptoms of this condition include:  Frequent urination.  Strong or persistent urge to urinate.  Burning or stinging when urinating.  Abdominal pain.  Back pain.  Pain in the side or flank area.  Fever.  Chills.  Blood in the urine, or dark urine.  Nausea.  Vomiting. DIAGNOSIS This condition may be diagnosed based on:  Medical history and physical exam.  Urine tests.  Blood tests. You may also have imaging tests of the kidneys, such as an ultrasound or CT scan. TREATMENT Treatment for this condition may depend on the severity of the infection.  If the infection is mild and is found early, you may be treated with antibiotic medicines taken by mouth. You will need to drink fluids to remain hydrated.  If the infection is more severe, you may need to stay in the hospital and receive antibiotics given directly into a vein through an IV tube. You may also need to receive fluids through an IV tube if you are not able to remain hydrated. After your hospital stay, you may need to take oral antibiotics for a period of time. Other treatments may be required, depending on the cause of the infection. HOME CARE INSTRUCTIONS Medicines  Take over-the-counter and prescription medicines only as told by your health care provider.    If you were prescribed an antibiotic medicine, take it as told by your health care provider. Do not stop taking the antibiotic even if you start to feel better. General Instructions  Drink enough fluid to keep your urine clear or pale yellow.  Avoid caffeine, tea, and carbonated beverages. They tend to  irritate the bladder.  Urinate often. Avoid holding in urine for long periods of time.  Urinate before and after sex.  After a bowel movement, women should cleanse from front to back. Use each tissue only once.  Keep all follow-up visits as told by your health care provider. This is important. SEEK MEDICAL CARE IF:  Your symptoms do not get better after 2 days of treatment.  Your symptoms get worse.  You have a fever. SEEK IMMEDIATE MEDICAL CARE IF:  You are unable to take your antibiotics or fluids.  You have shaking chills.  You vomit.  You have severe flank or back pain.  You have extreme weakness or fainting.   This information is not intended to replace advice given to you by your health care provider. Make sure you discuss any questions you have with your health care provider.   Document Released: 12/09/2005 Document Revised: 08/30/2015 Document Reviewed: 04/03/2015 Elsevier Interactive Patient Education 2016 Elsevier Inc.  

## 2016-04-05 NOTE — Progress Notes (Signed)
By signing my name below I, Shelah LewandowskyJoseph Thomas, attest that this documentation has been prepared under the direction and in the presence of Norberto SorensonEva Shaw, MD. Electonically Signed. Shelah LewandowskyJoseph Thomas, Scribe 04/05/2016 at 11:31 AM   Subjective:    Patient ID: Sandra SaxAnna Kemp, female    DOB: 1976-04-24, 40 y.o.   MRN: [id number]  Chief Complaint  Patient presents with  . Fever  . Sore Throat  . exposed to mono per patient    HPI Sandra Kemp is a 40 y.o. female who presents to the [facility] complaining of low grade fever that has been intermittent and worse in the evenings for the past 8 days. Pt also c/o sore throat.   Pt reports that her 40 year old son was recently dx with mono.  Pt also states that she has started having bilat flank pain that has been constant since its onset 2 days ago. Pt states pain has been causing her to have trouble sleeping at night.  Pt has 5 kids ranging from 572-40 years old.   Past Medical History  Diagnosis Date  . MTHFR deficiency complicating pregnancy (HCC)   . ANA positive   . Vaginal Pap smear, abnormal   . Hx of varicella   . Hx of pyelonephritis   . AMA (advanced maternal age) multigravida 35+     Current outpatient prescriptions:  .  ibuprofen (ADVIL,MOTRIN) 600 MG tablet, Take 1 tablet (600 mg total) by mouth every 6 (six) hours., Disp: 30 tablet, Rfl: 1 .  phenazopyridine (PYRIDIUM) 200 MG tablet, Take 1 tablet (200 mg total) by mouth 3 (three) times daily as needed for pain., Disp: 10 tablet, Rfl: 0 .  sulfamethoxazole-trimethoprim (BACTRIM DS,SEPTRA DS) 800-160 MG tablet, Take 1 tablet by mouth 2 (two) times daily., Disp: 10 tablet, Rfl: 0  No Known Allergies  Depression screen PHQ 2/9 04/05/2016  Decreased Interest 0  Down, Depressed, Hopeless 0  PHQ - 2 Score 0        Review of Systems  Constitutional: Positive for fever.  HENT: Positive for sore throat.   Eyes: Negative for photophobia.  Respiratory: Negative for  cough.   Cardiovascular: Negative for chest pain.  Gastrointestinal: Negative for abdominal pain.  Genitourinary: Positive for flank pain (bilat).  Musculoskeletal: Negative for back pain.  Skin: Negative for rash.  Neurological: Negative for headaches.  Psychiatric/Behavioral: Positive for sleep disturbance.       Objective:  BP 110/70 mmHg  Pulse 84  Temp(Src) 97.5 F (36.4 C) (Oral)  Resp 17  Ht 5\' 10"  (1.778 m)  Wt 133 lb (60.328 kg)  BMI 19.08 kg/m2  SpO2 98%  Physical Exam  Constitutional: She is oriented to person, place, and time. She appears well-developed and well-nourished. No distress.  HENT:  Head: Normocephalic and atraumatic.  Right Ear: Tympanic membrane normal.  Left Ear: Tympanic membrane normal.  Nose: Nose normal.  Mouth/Throat: Posterior oropharyngeal erythema present.  Eyes: Conjunctivae are normal. Pupils are equal, round, and reactive to light.  Neck: Neck supple.  Cardiovascular: Normal rate and regular rhythm.  Exam reveals no gallop.   No murmur heard. Pulmonary/Chest: Effort normal and breath sounds normal. No accessory muscle usage. No respiratory distress. She has no decreased breath sounds. She has no wheezes. She has no rhonchi. She has no rales.  Abdominal: Soft. Normal appearance and bowel sounds are normal. There is no hepatosplenomegaly, splenomegaly or hepatomegaly. There is tenderness in the suprapubic area and left lower quadrant. There  is CVA tenderness (bilat).  Musculoskeletal: Normal range of motion.  Lymphadenopathy:    She has cervical adenopathy (anterior and posterior bilat.).       Right cervical: Posterior cervical adenopathy present.       Left cervical: Posterior cervical adenopathy present.  Neurological: She is alert and oriented to person, place, and time. Gait normal.  Skin: Skin is warm and dry.  Psychiatric: She has a normal mood and affect. Her behavior is normal.  Nursing note and vitals reviewed.  Results for  orders placed or performed in visit on 04/05/16  POCT urinalysis dipstick  Result Value Ref Range   Color, UA yellow yellow   Clarity, UA cloudy (A) clear   Glucose, UA negative negative   Bilirubin, UA negative negative   Ketones, POC UA negative negative   Spec Grav, UA 1.010    Blood, UA moderate (A) negative   pH, UA 6.0    Protein Ur, POC negative negative   Urobilinogen, UA 0.2    Nitrite, UA Positive (A) Negative   Leukocytes, UA small (1+) (A) Negative  POCT Microscopic Urinalysis ([facility])  Result Value Ref Range   WBC,UR,HPF,POC Moderate (A) None WBC/hpf   RBC,UR,HPF,POC Moderate (A) None RBC/hpf   Bacteria Many (A) None, Too numerous to count   Mucus Present (A) Absent   Epithelial Cells, UR Per Microscopy Moderate (A) None, Too numerous to count cells/hpf  POCT CBC  Result Value Ref Range   WBC 7.9 4.6 - 10.2 K/uL   Lymph, poc 2.1 0.6 - 3.4   POC LYMPH PERCENT 26.5 10 - 50 %L   MID (cbc) 0.4 0 - 0.9   POC MID % 5.5 0 - 12 %M   POC Granulocyte 5.4 2 - 6.9   Granulocyte percent 68.0 37 - 80 %G   RBC 4.66 4.04 - 5.48 M/uL   Hemoglobin 14.3 12.2 - 16.2 g/dL   HCT, POC 16.1 09.6 - 47.9 %   MCV 88.1 80 - 97 fL   MCH, POC 30.6 27 - 31.2 pg   MCHC 34.7 31.8 - 35.4 g/dL   RDW, POC 04.5 %   Platelet Count, POC 312 142 - 424 K/uL   MPV 7.2 0 - 99.8 fL       Assessment & Plan:   1. Acute pharyngitis, unspecified etiology   2. History of exposure to infectious disease   3. Abdominal pain, left lower quadrant   4. Acute cystitis with hematuria   Pt requests pain med to help her sleep at night during acute sxs. Pt's 6 yo son has had mono - diagnosed sev wks prior - and now pt is developing similar sxs so check labs to confirm - she denies any prior diagnosis of infectious mononucleosis or close contacts. Reviewed transmission routes and prevention. Symptomatic care. Pt does appear to have developed pyelonephrotics with subj f/c and flank pain - Bactrim bid x  5d.   Orders Placed This Encounter  Procedures  . Urine culture  . Epstein-Barr virus VCA antibody panel  . Comprehensive metabolic panel  . POCT urinalysis dipstick  . POCT Microscopic Urinalysis ([facility])  . POCT CBC    Meds ordered this encounter  Medications  . phenazopyridine (PYRIDIUM) 200 MG tablet    Sig: Take 1 tablet (200 mg total) by mouth 3 (three) times daily as needed for pain.    Dispense:  10 tablet    Refill:  0  . sulfamethoxazole-trimethoprim (BACTRIM DS,SEPTRA DS) 800-160 MG  tablet    Sig: Take 1 tablet by mouth 2 (two) times daily.    Dispense:  10 tablet    Refill:  0  . HYDROcodone-acetaminophen (NORCO/VICODIN) 5-325 MG tablet    Sig: Take 1 tablet by mouth every 6 (six) hours as needed for moderate pain.    Dispense:  30 tablet    Refill:  0    I personally performed the services described in this documentation, which was scribed in my presence. The recorded information has been reviewed and considered, and addended by me as needed.  Norberto Sorenson, MD MPH

## 2016-04-06 LAB — COMPREHENSIVE METABOLIC PANEL
ALBUMIN: 4.8 g/dL (ref 3.6–5.1)
ALT: 24 U/L (ref 6–29)
AST: 15 U/L (ref 10–30)
Alkaline Phosphatase: 102 U/L (ref 33–115)
BILIRUBIN TOTAL: 0.7 mg/dL (ref 0.2–1.2)
BUN: 11 mg/dL (ref 7–25)
CALCIUM: 9.6 mg/dL (ref 8.6–10.2)
CHLORIDE: 104 mmol/L (ref 98–110)
CO2: 26 mmol/L (ref 20–31)
Creat: 0.61 mg/dL (ref 0.50–1.10)
GLUCOSE: 85 mg/dL (ref 65–99)
Potassium: 4.2 mmol/L (ref 3.5–5.3)
Sodium: 137 mmol/L (ref 135–146)
Total Protein: 7.4 g/dL (ref 6.1–8.1)

## 2016-04-08 LAB — EPSTEIN-BARR VIRUS VCA ANTIBODY PANEL
EBV EA IgG: <5 U/mL (ref <9.0)
EBV NA IgG: 177 U/mL — ABNORMAL HIGH (ref <18.0)
EBV VCA IgG: 63.3 U/mL — ABNORMAL HIGH (ref <18.0)
EBV VCA IgM: <10 U/mL (ref <36.0)

## 2016-04-08 LAB — URINE CULTURE: Colony Count: >=100000

## 2016-04-11 ENCOUNTER — Telehealth: Payer: Self-pay

## 2016-04-11 ENCOUNTER — Encounter: Payer: Self-pay | Admitting: Family Medicine

## 2016-04-11 MED ORDER — AMOXICILLIN-POT CLAVULANATE 875-125 MG PO TABS: 1.0000 | ORAL_TABLET | Freq: Two times a day (BID) | ORAL | Status: DC

## 2016-04-11 NOTE — Telephone Encounter (Signed)
Msg is for Dr. Clelia CroftShaw, Pts husband said that his wife is getting worse, she has finished the antibiotics. Her temperature was 101.6 last time he checked it.  Please advise  [phone]

## 2016-04-11 NOTE — Telephone Encounter (Signed)
Pt is needing to talk with someone about lab results -she is worse has a high fever and body aches  Best number [phone]

## 2016-04-11 NOTE — Telephone Encounter (Signed)
She had mono at some point in the past and have antibodies against it so less likely to catch it again. Her urine grew out E. Coli which the Bactrim was effective against so definitely should have gotten better on it. Needs to be seen again asap.  [facility]

## 2016-04-11 NOTE — Telephone Encounter (Signed)
Last night she started to have joint point in her knees and ankles and temp up to 101.6.  She is having urinary frequency but only in the evening.  She is still having flank pain at times and no abd pain, no n/v, urine still looks cloudy. No rash. Pt finished 5d course of bactrim 2d ago and has been progressively worsening since - assume this is pyelo that was not completely gone so will switch pt to augmentin with an extended course. Try to come in the weekend to have labs rechecked - cbc, esr, crp, ua

## 2016-04-11 NOTE — Telephone Encounter (Signed)
Spoke with pt, advised pt to come in. She wanted to know her lab results for mono. It looks like she does not have an active infection. I advised her to come in to been seen again. She complained of fever and back pain. She agreed but has to find someone to watch her children. Please advise Dr. Clelia CroftShaw.

## 2016-04-14 NOTE — Telephone Encounter (Signed)
See other phone message from Dr. Clelia CroftShaw

## 2018-03-06 ENCOUNTER — Ambulatory Visit (INDEPENDENT_AMBULATORY_CARE_PROVIDER_SITE_OTHER): Payer: 59 | Admitting: Physician Assistant

## 2018-03-06 ENCOUNTER — Other Ambulatory Visit: Payer: Self-pay

## 2018-03-06 ENCOUNTER — Encounter: Payer: Self-pay | Admitting: Physician Assistant

## 2018-03-06 VITALS — BP 128/84 | HR 95 | Temp 97.9°F | Resp 16 | Ht 70.0 in | Wt 128.8 lb

## 2018-03-06 DIAGNOSIS — B373 Candidiasis of vulva and vagina: Secondary | ICD-10-CM | POA: Diagnosis not present

## 2018-03-06 DIAGNOSIS — B3731 Acute candidiasis of vulva and vagina: Secondary | ICD-10-CM

## 2018-03-06 DIAGNOSIS — N23 Unspecified renal colic: Secondary | ICD-10-CM | POA: Diagnosis not present

## 2018-03-06 DIAGNOSIS — R509 Fever, unspecified: Secondary | ICD-10-CM

## 2018-03-06 DIAGNOSIS — R319 Hematuria, unspecified: Secondary | ICD-10-CM | POA: Diagnosis not present

## 2018-03-06 LAB — POCT CBC
Granulocyte percent: 68.6 %G (ref 37–80)
HCT, POC: 38.3 % (ref 37.7–47.9)
Hemoglobin: 12.5 g/dL (ref 12.2–16.2)
Lymph, poc: 1.7 (ref 0.6–3.4)
MCH, POC: 28.7 pg (ref 27–31.2)
MCHC: 32.7 g/dL (ref 31.8–35.4)
MCV: 87.7 fL (ref 80–97)
MID (cbc): 0.4 (ref 0–0.9)
MPV: 6.7 fL (ref 0–99.8)
POC Granulocyte: 4.5 (ref 2–6.9)
POC LYMPH PERCENT: 25.5 %L (ref 10–50)
POC MID %: 5.9 % (ref 0–12)
Platelet Count, POC: 404 10*3/uL (ref 142–424)
RBC: 4.37 M/uL (ref 4.04–5.48)
RDW, POC: 13.5 %
WBC: 6.5 10*3/uL (ref 4.6–10.2)

## 2018-03-06 LAB — POCT WET + KOH PREP: Trich by wet prep: ABSENT

## 2018-03-06 LAB — POC MICROSCOPIC URINALYSIS (UMFC): Mucus: ABSENT

## 2018-03-06 LAB — POCT URINALYSIS DIP (MANUAL ENTRY)
Bilirubin, UA: NEGATIVE
Glucose, UA: NEGATIVE mg/dL
Ketones, POC UA: NEGATIVE mg/dL
Leukocytes, UA: NEGATIVE
Nitrite, UA: NEGATIVE
Protein Ur, POC: NEGATIVE mg/dL
Spec Grav, UA: 1.01 (ref 1.010–1.025)
Urobilinogen, UA: 0.2 U/dL
pH, UA: 7 (ref 5.0–8.0)

## 2018-03-06 MED ORDER — FLUCONAZOLE 150 MG PO TABS: 150.0000 mg | ORAL_TABLET | Freq: Once | ORAL | 0 refills | Status: AC

## 2018-03-06 NOTE — Progress Notes (Signed)
Sandra Kemp  MRN: [id number] DOB: 12-15-1976  PCP: Patient, No Pcp Per  Subjective:  Pt is a 42 year old female who presents to clinic for several complaints.   Fever - Off and on x 1 month. Has gotten up to 100. She was treated with amoxicillin two weeks ago at urgent care. She has intermittent feelings of feeling drained and "wiped out" x 1 month.   Kidney pain b/l intermittent x 2 weeks.  "feels like a deep pain" Pain is described as "dull" and is located mid back on both sides. Comes and goes.  Denies midline tenderness. Denies urinary symptoms, vaginal symptoms, chills. She endorses 2-3 episodes of night sweats, "sometimes my chest is sweaty when I wake up".  She is currently ovulating.   Regular check-ups with her GYN.   Review of Systems  Constitutional: Positive for diaphoresis, fatigue and fever. Negative for activity change, appetite change, chills and unexpected weight change.  Respiratory: Negative for cough, shortness of breath and wheezing.   Cardiovascular: Negative for chest pain and palpitations.  Gastrointestinal: Negative for abdominal pain, diarrhea, nausea and vomiting.  Genitourinary: Negative for decreased urine volume, difficulty urinating, dysuria, enuresis, flank pain, frequency, hematuria and urgency.  Musculoskeletal: Positive for back pain.  Neurological: Negative for dizziness, syncope, weakness, light-headedness and headaches.    Patient Active Problem List   Diagnosis Date Noted  . Pregnancy 04/05/2014  . NSVD (normal spontaneous vaginal delivery) 04/05/2014    Current Outpatient Medications on File Prior to Visit  Medication Sig Dispense Refill  . HYDROcodone-acetaminophen (NORCO/VICODIN) 5-325 MG tablet Take 1 tablet by mouth every 6 (six) hours as needed for moderate pain. (Patient not taking: Reported on 03/06/2018) 30 tablet 0   No current facility-administered medications on file prior to visit.     No Known Allergies   Objective:  BP  128/84   Pulse 95   Temp 97.9 F (36.6 C) (Oral)   Resp 16   Ht '5\' 10"'  (1.778 m)   Wt 128 lb 12.8 oz (58.4 kg)   LMP 02/20/2018   SpO2 100%   BMI 18.48 kg/m   Physical Exam  Constitutional: She is oriented to person, place, and time and well-developed, well-nourished, and in no distress. No distress.  Cardiovascular: Normal rate, regular rhythm and normal heart sounds.  Abdominal: Soft. Normal appearance and bowel sounds are normal. There is tenderness. There is CVA tenderness (b/l).  Genitourinary: Uterus normal, right adnexa normal, left adnexa normal and vulva normal.  Cervix is not fixed. Cervix exhibits motion tenderness ("Feels like the pain I have when I'm ovulating"). Cervix exhibits no lesion. Thick  white and vaginal discharge found.  Neurological: She is alert and oriented to person, place, and time. GCS score is 15.  Skin: Skin is warm and dry.  Psychiatric: Mood, memory, affect and judgment normal.  Vitals reviewed.   Results for orders placed or performed in visit on 03/06/18  POCT Microscopic Urinalysis ([facility])  Result Value Ref Range   WBC,UR,HPF,POC None None WBC/hpf   RBC,UR,HPF,POC None None RBC/hpf   Bacteria None None, Too numerous to count   Mucus Absent Absent   Epithelial Cells, UR Per Microscopy Moderate (A) None, Too numerous to count cells/hpf  POCT urinalysis dipstick  Result Value Ref Range   Color, UA yellow yellow   Clarity, UA clear clear   Glucose, UA negative negative mg/dL   Bilirubin, UA negative negative   Ketones, POC UA negative negative mg/dL   Spec  Grav, UA 1.010 1.010 - 1.025   Blood, UA small (A) negative   pH, UA 7.0 5.0 - 8.0   Protein Ur, POC negative negative mg/dL   Urobilinogen, UA 0.2 0.2 or 1.0 E.U./dL   Nitrite, UA Negative Negative   Leukocytes, UA Negative Negative  POCT Wet + KOH Prep  Result Value Ref Range   Yeast by KOH Present (A) Absent   Yeast by wet prep Present (A) Absent   WBC by wet prep Few Few   Clue  Cells Wet Prep HPF POC None None   Trich by wet prep Absent Absent   Bacteria Wet Prep HPF POC Few Few   Epithelial Cells By [employer] Pref ([facility]) Many (A) None, Few, Too numerous to count   RBC,UR,HPF,POC None None RBC/hpf  POCT CBC  Result Value Ref Range   WBC 6.5 4.6 - 10.2 K/uL   Lymph, poc 1.7 0.6 - 3.4   POC LYMPH PERCENT 25.5 10 - 50 %L   MID (cbc) 0.4 0 - 0.9   POC MID % 5.9 0 - 12 %M   POC Granulocyte 4.5 2 - 6.9   Granulocyte percent 68.6 37 - 80 %G   RBC 4.37 4.04 - 5.48 M/uL   Hemoglobin 12.5 12.2 - 16.2 g/dL   HCT, POC 38.3 37.7 - 47.9 %   MCV 87.7 80 - 97 fL   MCH, POC 28.7 27 - 31.2 pg   MCHC 32.7 31.8 - 35.4 g/dL   RDW, POC 13.5 %   Platelet Count, POC 404 142 - 424 K/uL   MPV 6.7 0 - 99.8 fL    Assessment and Plan :  1. Yeast vaginitis - fluconazole (DIFLUCAN) 150 MG tablet; Take 1 tablet (150 mg total) by mouth once for 1 dose. Repeat if needed  Dispense: 2 tablet; Refill: 0 - Pt presents with undulating fever and intermittent symptoms of "kidney pain". CVA tenderness on PE. WBC count wnl, UA +hematuria, urine micro -RBC. Wetprep is +yeast. No sign of UTI. Low suspicion for kidney stone. will treat with diflucan. Other labs are pending. Will contact with results and plan. Advised pt to RTC if symptoms worsen.  2. Hematuria, unspecified type - Urinalysis, dipstick only; Future - Urine microscopic; Future 3. Kidney pain - POCT Microscopic Urinalysis ([facility]) - POCT urinalysis dipstick - Urine Culture - POCT Wet + KOH Prep - POCT CBC - CMP14+EGFR - Chlamydia/Gonococcus/Trichomonas, NAA  4. Fever, unspecified fever cause - POCT Wet + KOH Prep - POCT CBC - CMP14+EGFR - Chlamydia/Gonococcus/Trichomonas, NAA - Sedimentation Rate - C-reactive protein - RPR   Mercer Pod, PA-C  [facility] 03/06/2018 10:46 AM

## 2018-03-06 NOTE — Patient Instructions (Addendum)
You are positive for yeast infection. Take Diflucan as directed.  White blood cell count is normal.  Negative BV Negative trichomonas.  Please come back for a LAB ONLY visit in 3-4 weeks to provide a urine sample to follow up on the blood in your urine.  Stay well hydrated. Drink 1-2 liters of water daily.  Schedule an appt with your GYN for routine PAP.  We will contact you with the results of your labs when they come back.   Come back and see me if your symptoms worsen. Continue to keep a good detailed log of your symptoms like you have been. This is helpful.   Thank you for coming in today. I hope you feel we met your needs.  Feel free to call PCP if you have any questions or further requests.  Please consider signing up for MyChart if you do not already have it, as this is a great way to communicate with me.  Best,  Whitney McVey, PA-C   IF you received an x-ray today, you will receive an invoice from [facility]. Please contact [facility] at [phone] with questions or concerns regarding your invoice.   IF you received labwork today, you will receive an invoice from [facility]. Please contact [facility] at [phone] with questions or concerns regarding your invoice.   Our billing staff will not be able to assist you with questions regarding bills from these companies.  You will be contacted with the lab results as soon as they are available. The fastest way to get your results is to activate your My Chart account. Instructions are located on the last page of this paperwork. If you have not heard from [name] regarding the results in 2 weeks, please contact this office.

## 2018-03-07 LAB — URINE CULTURE: Organism ID, Bacteria: NO GROWTH

## 2018-03-09 LAB — CMP14+EGFR
ALT: 31 IU/L (ref 0–32)
AST: 24 IU/L (ref 0–40)
Albumin/Globulin Ratio: 1.8 (ref 1.2–2.2)
Albumin: 4.6 g/dL (ref 3.5–5.5)
Alkaline Phosphatase: 87 IU/L (ref 39–117)
BUN/Creatinine Ratio: 15 (ref 9–23)
BUN: 11 mg/dL (ref 6–24)
Bilirubin Total: 0.5 mg/dL (ref 0.0–1.2)
CO2: 22 mmol/L (ref 20–29)
Calcium: 9.8 mg/dL (ref 8.7–10.2)
Chloride: 100 mmol/L (ref 96–106)
Creatinine, Ser: 0.75 mg/dL (ref 0.57–1.00)
GFR calc Af Amer: 114 mL/min/{1.73_m2} (ref >59)
GFR calc non Af Amer: 99 mL/min/{1.73_m2} (ref >59)
Globulin, Total: 2.6 g/dL (ref 1.5–4.5)
Glucose: 94 mg/dL (ref 65–99)
Potassium: 4.2 mmol/L (ref 3.5–5.2)
Sodium: 139 mmol/L (ref 134–144)
Total Protein: 7.2 g/dL (ref 6.0–8.5)

## 2018-03-09 LAB — RPR, QUANT+TP ABS (REFLEX)
Rapid Plasma Reagin, Quant: 1:2 {titer} — ABNORMAL HIGH
T Pallidum Abs: NEGATIVE

## 2018-03-09 LAB — RPR: RPR Ser Ql: REACTIVE — AB

## 2018-03-09 LAB — SEDIMENTATION RATE: Sed Rate: 5 mm/h (ref 0–32)

## 2018-03-09 LAB — C-REACTIVE PROTEIN: CRP: 1.1 mg/L (ref 0.0–4.9)

## 2018-03-10 LAB — CHLAMYDIA/GONOCOCCUS/TRICHOMONAS, NAA
Chlamydia by NAA: NEGATIVE
Gonococcus by NAA: NEGATIVE
Trich vag by NAA: NEGATIVE

## 2018-03-12 ENCOUNTER — Encounter: Payer: Self-pay | Admitting: Physician Assistant

## 2018-03-13 ENCOUNTER — Encounter: Payer: Self-pay | Admitting: Physician Assistant

## 2018-03-18 ENCOUNTER — Telehealth: Payer: Self-pay

## 2018-03-18 NOTE — Telephone Encounter (Signed)
Copied from CRM [id number]. Topic: General - Other >> Mar 17, 2018  2:13 PM Percival SpanishKennedy, Cheryl W wrote:  Pt  would like a call back as to what to nex with her having autoimmune disease  [phone]

## 2018-03-19 NOTE — Telephone Encounter (Signed)
°  Pt. Has been trying to get a hold of doctor for days with no response.  She is needing to speak to some one today.   She can not get off couch. Has bad headaches, lungs are hurting and is exhausted.  Please call pt. Back today!

## 2018-03-19 NOTE — Telephone Encounter (Signed)
Spoke with patient and based on current complaint of dizziness, headache, pain, and lethargy I recommended follow up visit as soon as possible. Pt states that symptoms are intermittent and not requiring her to go to the ED. Pt stated she will schedule for tomorrow.

## 2018-03-20 ENCOUNTER — Encounter: Payer: Self-pay | Admitting: Physician Assistant

## 2018-03-20 ENCOUNTER — Ambulatory Visit (INDEPENDENT_AMBULATORY_CARE_PROVIDER_SITE_OTHER): Payer: 59 | Admitting: Physician Assistant

## 2018-03-20 ENCOUNTER — Other Ambulatory Visit: Payer: Self-pay

## 2018-03-20 VITALS — BP 131/77 | HR 78 | Temp 98.4°F | Resp 16 | Ht 70.0 in | Wt 127.2 lb

## 2018-03-20 DIAGNOSIS — R319 Hematuria, unspecified: Secondary | ICD-10-CM

## 2018-03-20 DIAGNOSIS — N23 Unspecified renal colic: Secondary | ICD-10-CM

## 2018-03-20 DIAGNOSIS — R109 Unspecified abdominal pain: Secondary | ICD-10-CM

## 2018-03-20 DIAGNOSIS — R232 Flushing: Secondary | ICD-10-CM | POA: Diagnosis not present

## 2018-03-20 LAB — POC MICROSCOPIC URINALYSIS (UMFC): Mucus: ABSENT

## 2018-03-20 LAB — POCT URINALYSIS DIP (MANUAL ENTRY)
Bilirubin, UA: NEGATIVE
Glucose, UA: NEGATIVE mg/dL
Nitrite, UA: NEGATIVE
Protein Ur, POC: NEGATIVE mg/dL
Spec Grav, UA: 1.025 (ref 1.010–1.025)
Urobilinogen, UA: 0.2 U/dL
pH, UA: 7 (ref 5.0–8.0)

## 2018-03-20 NOTE — Patient Instructions (Addendum)
You will receive a phone call to schedule a CT of your abdomen as well as a call to sched an appointment with rheumatology.   Please come back in 2-3 weeks (not menstruating) for a LAB ONLY visit to provide a urine sample.   Thank you for coming in today. I hope you feel we met your needs.  Feel free to call PCP if you have any questions or further requests.  Please consider signing up for MyChart if you do not already have it, as this is a great way to communicate with me.  Best,  Whitney McVey, PA-C   IF you received an x-ray today, you will receive an invoice from [facility]. Please contact [facility] at [phone] with questions or concerns regarding your invoice.   IF you received labwork today, you will receive an invoice from [facility]. Please contact [facility] at [phone] with questions or concerns regarding your invoice.   Our billing staff will not be able to assist you with questions regarding bills from these companies.  You will be contacted with the lab results as soon as they are available. The fastest way to get your results is to activate your My Chart account. Instructions are located on the last page of this paperwork. If you have not heard from [name] regarding the results in 2 weeks, please contact this office.

## 2018-03-20 NOTE — Progress Notes (Signed)
   Sandra Kemp  MRN: [id number] DOB: 11-Oct-1976  PCP: Patient, No Pcp Per  Subjective:  Pt is a pleasant 42 year old female who presents to clinic for f/u.   She c/o +nausea, headache, neck pain, purple toes. "Feverish all the time. Feels like burning from the inside" facial flushing. 2-3 times week will get fever of 100. Occasionally cold and purple fingers. "I think this is new". Endorses "kidney pain" worse at night 8/10 "feels like my back is on fire". During the day pain is 3/10 Denies urinary symptoms, abdominal pain, vomiting.   Review of Systems  Constitutional: Positive for diaphoresis.  Gastrointestinal: Positive for nausea.  Musculoskeletal: Positive for back pain and neck pain. Negative for neck stiffness.  Neurological: Positive for headaches.    Patient Active Problem List   Diagnosis Date Noted  . Hematuria 03/06/2018    No current outpatient medications on file prior to visit.   No current facility-administered medications on file prior to visit.     No Known Allergies   Objective:  BP 131/77   Pulse 78   Temp 98.4 F (36.9 C) (Oral)   Resp 16   Ht 5\' 10"  (1.778 m)   Wt 127 lb 3.2 oz (57.7 kg)   LMP 02/20/2018   SpO2 96%   BMI 18.25 kg/m   Physical Exam  Constitutional: She is oriented to person, place, and time and well-developed, well-nourished, and in no distress. No distress.  Cardiovascular: Normal rate, regular rhythm and normal heart sounds.  Abdominal: Soft. Normal appearance. There is no tenderness. There is no CVA tenderness.  Neurological: She is alert and oriented to person, place, and time. GCS score is 15.  Skin: Skin is warm and dry.  Psychiatric: Mood, memory, affect and judgment normal.  Vitals reviewed.   Assessment and Plan :  1. Flank pain 2. Kidney pain 3. Facial flushing - Urinalysis, dipstick only; Future - CT RENAL ABDOMEN W WO CONTRAST; Future - POCT Microscopic Urinalysis ([facility]) - POCT urinalysis dipstick - Urine  Culture - Ambulatory referral to Rheumatology - pt presents with an interesting constellation of symptoms. She had a negative work-up at her last appt. She still c/o of symptoms. Plan to CT abdomen given HPI of severe flank pain. Plan to refer to rheumatology for further evaluation and work-up. She understands and agrees.  4. Abdominal pain, unspecified abdominal location 5. Hematuria, unspecified type   Marco CollieWhitney Kort Stettler, PA-C  Primary Care at [facility] 03/20/2018 6:04 PM

## 2018-03-22 LAB — URINE CULTURE

## 2018-03-23 ENCOUNTER — Telehealth: Payer: Self-pay | Admitting: Physician Assistant

## 2018-03-23 ENCOUNTER — Encounter: Payer: Self-pay | Admitting: Physician Assistant

## 2018-03-23 ENCOUNTER — Other Ambulatory Visit: Payer: Self-pay | Admitting: *Deleted

## 2018-03-23 DIAGNOSIS — R109 Unspecified abdominal pain: Secondary | ICD-10-CM

## 2018-03-23 NOTE — Telephone Encounter (Signed)
Copied from CRM [id number]. Topic: Quick Communication - See Telephone Encounter >> Mar 23, 2018  3:24 PM Terisa Starraylor, Brittany L wrote: CRM for notification. See Telephone encounter for: 03/23/18.  [facility] said the pt is coming in tomorrow for a CT of the abdomen/pelvis w/o contrast and wants a copy of the order faxed over to them asap. thanks [phone]

## 2018-03-23 NOTE — Telephone Encounter (Signed)
Called [facility] Scheduling to get Renal CT Abdomen w and w/o contrast scheduled for pt. They said if provider is suspecting a stone, order would need to be changed to CT Abdomen and Pelvis w/o contrast. They stated the Abdomen only will stop at the belly button. I have contacted pt to let her know I am working on getting her CT scheduled and will be in touch with her once appt is made. Please advise. Thanks!

## 2018-03-23 NOTE — Telephone Encounter (Signed)
Order has been changed. Pt is scheduled for tomorrow, 03/24/18 at 3:30pm. I let her know this was a STAT order and she needed to be scheduled as soon as possible, but pt said she preferred tomorrow if possible. She is to [facility] and be NPO 4 hours prior to scan. She also needs [facility]. I spoke with pt over the phone and advised of instructions. Pt verbalized understanding. I also called pt's insurance to get prior auth for scan, and it was not needed. They did say however, that the reading of the report would not be covered by insurance and they could start a review after scan, requiring past 3 years of medical notes for pt and if they find that pt had any pre-existing medical conditions, they may not cover scan. I also let pt know this on the phone and she was appreciative and said she would contact her insurance company to get more details. Thanks!

## 2018-03-24 ENCOUNTER — Ambulatory Visit (HOSPITAL_COMMUNITY)
Admission: RE | Admit: 2018-03-24 | Discharge: 2018-03-24 | Disposition: A | Discharge status: Home / Self Care | Payer: 59 | Source: Ambulatory Visit | Attending: Physician Assistant | Admitting: Physician Assistant

## 2018-03-24 DIAGNOSIS — R109 Unspecified abdominal pain: Secondary | ICD-10-CM | POA: Insufficient documentation

## 2018-03-24 DIAGNOSIS — Z975 Presence of (intrauterine) contraceptive device: Secondary | ICD-10-CM | POA: Insufficient documentation

## 2018-03-24 NOTE — Telephone Encounter (Signed)
Signed order by Benjiman CoreBrittany Wiseman sent to [facility] at [phone] on 4/2

## 2018-03-27 ENCOUNTER — Other Ambulatory Visit: Payer: Self-pay | Admitting: Physician Assistant

## 2018-03-27 DIAGNOSIS — N3001 Acute cystitis with hematuria: Secondary | ICD-10-CM

## 2018-03-27 MED ORDER — CIPROFLOXACIN HCL 500 MG PO TABS: 500.0000 mg | ORAL_TABLET | Freq: Two times a day (BID) | ORAL | 0 refills | Status: DC

## 2018-04-28 ENCOUNTER — Encounter: Payer: Self-pay | Admitting: Family Medicine

## 2018-04-28 ENCOUNTER — Other Ambulatory Visit: Payer: Self-pay

## 2018-04-28 ENCOUNTER — Ambulatory Visit (INDEPENDENT_AMBULATORY_CARE_PROVIDER_SITE_OTHER): Payer: 59 | Admitting: Family Medicine

## 2018-04-28 VITALS — BP 116/68 | HR 117 | Temp 98.0°F | Resp 16 | Ht 69.75 in | Wt 124.0 lb

## 2018-04-28 DIAGNOSIS — R358 Other polyuria: Secondary | ICD-10-CM | POA: Diagnosis not present

## 2018-04-28 DIAGNOSIS — Z862 Personal history of diseases of the blood and blood-forming organs and certain disorders involving the immune mechanism: Secondary | ICD-10-CM

## 2018-04-28 DIAGNOSIS — R5382 Chronic fatigue, unspecified: Secondary | ICD-10-CM

## 2018-04-28 DIAGNOSIS — M2550 Pain in unspecified joint: Secondary | ICD-10-CM

## 2018-04-28 DIAGNOSIS — R768 Other specified abnormal immunological findings in serum: Secondary | ICD-10-CM | POA: Insufficient documentation

## 2018-04-28 DIAGNOSIS — F331 Major depressive disorder, recurrent, moderate: Secondary | ICD-10-CM | POA: Diagnosis not present

## 2018-04-28 DIAGNOSIS — Z87448 Personal history of other diseases of urinary system: Secondary | ICD-10-CM | POA: Insufficient documentation

## 2018-04-28 DIAGNOSIS — R000 Tachycardia, unspecified: Secondary | ICD-10-CM | POA: Diagnosis not present

## 2018-04-28 DIAGNOSIS — R3589 Other polyuria: Secondary | ICD-10-CM

## 2018-04-28 DIAGNOSIS — R634 Abnormal weight loss: Secondary | ICD-10-CM | POA: Diagnosis not present

## 2018-04-28 HISTORY — DX: Personal history of diseases of the blood and blood-forming organs and certain disorders involving the immune mechanism: Z86.2

## 2018-04-28 LAB — CBC WITH DIFFERENTIAL/PLATELET
BASOS PCT: 0.4 % (ref 0.0–3.0)
Basophils Absolute: 0 10*3/uL (ref 0.0–0.1)
EOS PCT: 0.4 % (ref 0.0–5.0)
Eosinophils Absolute: 0 10*3/uL (ref 0.0–0.7)
HCT: 40 % (ref 36.0–46.0)
HEMOGLOBIN: 13.7 g/dL (ref 12.0–15.0)
LYMPHS ABS: 1.9 10*3/uL (ref 0.7–4.0)
Lymphocytes Relative: 32.3 % (ref 12.0–46.0)
MCHC: 34.2 g/dL (ref 30.0–36.0)
MCV: 88.7 fl (ref 78.0–100.0)
MONO ABS: 0.2 10*3/uL (ref 0.1–1.0)
Monocytes Relative: 3.3 % (ref 3.0–12.0)
NEUTROS PCT: 63.6 % (ref 43.0–77.0)
Neutro Abs: 3.8 10*3/uL (ref 1.4–7.7)
Platelets: 343 10*3/uL (ref 150.0–400.0)
RBC: 4.51 Mil/uL (ref 3.87–5.11)
RDW: 13 % (ref 11.5–15.5)
WBC: 5.9 10*3/uL (ref 4.0–10.5)

## 2018-04-28 LAB — POCT URINALYSIS DIPSTICK
BILIRUBIN UA: NEGATIVE
GLUCOSE UA: NEGATIVE
Ketones, UA: NEGATIVE
Leukocytes, UA: NEGATIVE
Nitrite, UA: NEGATIVE
Protein, UA: NEGATIVE
RBC UA: NEGATIVE
Spec Grav, UA: 1.015 (ref 1.010–1.025)
Urobilinogen, UA: 0.2 E.U./dL
pH, UA: 7 (ref 5.0–8.0)

## 2018-04-28 LAB — COMPREHENSIVE METABOLIC PANEL
ALBUMIN: 4.9 g/dL (ref 3.5–5.2)
ALK PHOS: 67 U/L (ref 39–117)
ALT: 27 U/L (ref 0–35)
AST: 21 U/L (ref 0–37)
BILIRUBIN TOTAL: 0.4 mg/dL (ref 0.2–1.2)
BUN: 9 mg/dL (ref 6–23)
CALCIUM: 10 mg/dL (ref 8.4–10.5)
CHLORIDE: 101 meq/L (ref 96–112)
CO2: 28 mEq/L (ref 19–32)
CREATININE: 0.74 mg/dL (ref 0.40–1.20)
GFR: 91.68 mL/min (ref >60.00)
Glucose, Bld: 92 mg/dL (ref 70–99)
Potassium: 4 mEq/L (ref 3.5–5.1)
Sodium: 139 mEq/L (ref 135–145)
TOTAL PROTEIN: 7.6 g/dL (ref 6.0–8.3)

## 2018-04-28 LAB — VITAMIN D 25 HYDROXY (VIT D DEFICIENCY, FRACTURES): VITD: 35.18 ng/mL (ref 30.00–100.00)

## 2018-04-28 LAB — TSH: TSH: 1.1 u[IU]/mL (ref 0.35–4.50)

## 2018-04-28 LAB — T4, FREE: Free T4: 0.89 ng/dL (ref 0.60–1.60)

## 2018-04-28 MED ORDER — FLUOXETINE HCL 20 MG PO TABS: ORAL_TABLET | ORAL | 2 refills | Status: DC

## 2018-04-28 NOTE — Progress Notes (Signed)
Subjective  CC:  Chief Complaint  Patient presents with  . Establish Care    Autoimmune issues, wants PCP to establish with    HPI: Sandra Kemp is a 42 y.o. female who presents to [place] at [facility] today to establish care with me as a new patient. I have reviewed extensive records from prior ob and recent [facility].  She has the following concerns or needs:  Feeling terrible. Low energy, weight loss, flushing, arthralgias: elbows, wrists, knees and ankles, malaise, h/o low grade fevers, and now with sxs of depression: anhedonia, fatigue, feeling down. These sxs started about 2-3 months ago. She also had some urinary sxs and bilateral back pain; was worked up for flank pain at [facility]: had + e.coli UTI, but also was tested for syphyllis and inflammatory markers. Had nl cmp, false positive RPR, and nl esr, CRP. She reports she has lost 10 pounds in last 1-2 months in spite of eating her normal diet. She does yoga daily; she feels anxious and irritable. She is struggling with multiple family stressors including a new dx of autism in 10yo daughter, teen son with asperger's, teen son suffering from postconcussive syndrome and mother dxd with alzheimers. 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 She reports one mild episode of depression in her young adulthood reponsive to prozac.   We updated and reviewed the patient's past history in detail and it is documented below.  Patient Active Problem List   Diagnosis Date Noted  . Hx of pyelonephritis 04/28/2018  . ANA positive 04/28/2018  . History of antiphospholipid antibody syndrome 04/28/2018  . Depression, major, recurrent, moderate ([place]) 04/28/2018   Health Maintenance  Topic Date Due  . TETANUS/TDAP  10/15/1995  . PAP SMEAR  10/14/1997  . INFLUENZA VACCINE  07/23/2018  . HIV Screening  Completed   There is no immunization history for the selected administration types on file for this  patient. Current Meds  Medication Sig  . levonorgestrel (MIRENA, 52 MG,) 20 MCG/24HR IUD 1 each by Intrauterine route once.    Allergies: Patient has No Known Allergies. Past Medical History Patient  has a past medical history of AMA (advanced maternal age) multigravida 35+, ANA positive, History of antiphospholipid antibody syndrome (04/28/2018), pyelonephritis, varicella, MTHFR deficiency complicating pregnancy ([place]), and Vaginal Pap smear, abnormal. Past Surgical History Patient  has a past surgical history that includes Ectopic pregnancy surgery; Colposcopy; and Cosmetic surgery. Family History: Patient family history includes Alzheimer's disease in her mother; Autism in her daughter; Diabetes in her father; Healthy in her brother, daughter, son, and son; Hypertension in her father. Social History:  Patient  reports that she has never smoked. She has never used smokeless tobacco. She reports that she does not drink alcohol or use drugs.  Review of Systems: Constitutional: +for fever or malaise Ophthalmic: negative for photophobia, double vision or loss of vision Cardiovascular: negative for chest pain, dyspnea on exertion, or new LE swelling Respiratory: negative for SOB or persistent cough Gastrointestinal: negative for abdominal pain, change in bowel habits or melena Genitourinary: negative for dysuria or gross hematuria Musculoskeletal: negative for new gait disturbance or muscular weakness Integumentary: negative for new or persistent rashes Neurological: negative for TIA or stroke symptoms, + worsening tremor Psychiatric: negative for SI or delusions Allergic/Immunologic: negative for hives  Patient Care Team    Relationship Specialty Notifications Start End  Leamon Arnt, MD PCP - General Family Medicine  04/28/18  Rheumatology, [place] Physician   04/28/18     Objective  Vitals: BP 116/68   Pulse (!) 117   Temp 98 F (36.7 C) (Oral)   Resp 16   Ht 5'  9.75" (1.772 m)   Wt 124 lb (56.2 kg)   LMP 04/22/2018   SpO2 99%   BMI 17.92 kg/m  General:  Well developed, well nourished, tearful, thin Psych:  Alert and oriented,normal mood and affect HEENT:  Normocephalic, atraumatic, non-icteric sclera, PERRL, mild proptosis ?, oropharynx is without mass or exudate, supple neck without adenopathy, mass or thyromegaly Cardiovascular:  Regular mild tachycardia without gallop, rub or murmur, nondisplaced PMI Respiratory:  Good breath sounds bilaterally, CTAB with normal respiratory effort Gastrointestinal: normal bowel sounds, soft, non-tender, no noted masses. No HSM, no CVAT MSK: no deformities, contusions. Joints are without erythema or swelling or ttp Skin:  Warm, no rashes or suspicious lesions noted Neurologic:    Mental status is normal. Gross motor and sensory exams are normal. Normal gait, + tremor  Assessment  1. Chronic fatigue   2. Arthralgia, unspecified joint   3. Depression, major, recurrent, moderate ([place])   4. Weight loss   5. Tachycardia   6. Polyuria      Plan   Symptom complex concerning for hyperthyroidism. Check levels. Diff dx also included ebv, cmv, stress induced malaise with depression. Start prozac. Counseling done. Await results. Will then decide on need for rheum eval. Joints are normal now.   Follow up:  4 weeks for recheck mood and malaise  Commons side effects, risks, benefits, and alternatives for medications and treatment plan prescribed today were discussed, and the patient expressed understanding of the given instructions. Patient is instructed to call or message via MyChart if he/she has any questions or concerns regarding our treatment plan. No barriers to understanding were identified. We discussed Red Flag symptoms and signs in detail. Patient expressed understanding regarding what to do in case of urgent or emergency type symptoms.   Medication list was reconciled, printed and provided to the patient in  AVS. Patient instructions and summary information was reviewed with the patient as documented in the AVS. This note was prepared with assistance of Dragon voice recognition software. Occasional wrong-word or sound-a-like substitutions may have occurred due to the inherent limitations of voice recognition software  Orders Placed This Encounter  Procedures  . Urine Culture  . TSH  . T4, free  . T3  . CBC with Differential/Platelet  . EBV, Chronic/Active Infection  . Comprehensive metabolic panel  . VITAMIN D 25 Hydroxy (Vit-D Deficiency, Fractures)  . POCT urinalysis dipstick   Meds ordered this encounter  Medications  . FLUoxetine (PROZAC) 20 MG tablet    Sig: Take 0.5 tablets (10 mg total) by mouth daily for 7 days, THEN 1 tablet (20 mg total) daily.    Dispense:  30 tablet    Refill:  2

## 2018-04-28 NOTE — Patient Instructions (Signed)
Please return in 4-6 weeks for recheck.   I will release your lab results to you on your MyChart account with further instructions. Please reply with any questions.   Please start the prozac as directed.   It was a pleasure meeting you today! Thank you for choosing [name] to meet your healthcare needs! I truly look forward to working with you. If you have any questions or concerns, please send me a message via Mychart or call the office at [phone].  Depression Medications:  Taking the medicine as directed and not missing any doses is one of the best things you can do to treat your depression.  Here are some things to keep in mind:  1) Side effects (stomach upset, some increased anxiety) may happen before you notice a benefit.  These side effects typically go away over time. 2) Changes to your dose of medicine or a change in medication all together is sometimes necessary 3) Most people need to be on medication at least 6-12 months 4) Many people will notice an improvement within two weeks but the full effect of the medication can take up to 4-6 weeks 5) Stopping the medication when you start feeling better often results in a return of symptoms 6) If you start having thoughts of hurting yourself or others after starting this medicine, please call the office immediately at [phone].

## 2018-04-29 LAB — T3: T3 TOTAL: 116 ng/dL (ref 76–181)

## 2018-04-30 LAB — EBV, CHRONIC/ACTIVE INFECTION

## 2018-04-30 LAB — URINE CULTURE
MICRO NUMBER:: 90555757
SPECIMEN QUALITY:: ADEQUATE

## 2018-06-29 ENCOUNTER — Other Ambulatory Visit: Payer: Self-pay

## 2018-06-29 ENCOUNTER — Ambulatory Visit (INDEPENDENT_AMBULATORY_CARE_PROVIDER_SITE_OTHER): Payer: 59

## 2018-06-29 ENCOUNTER — Ambulatory Visit (INDEPENDENT_AMBULATORY_CARE_PROVIDER_SITE_OTHER): Payer: 59 | Admitting: Family Medicine

## 2018-06-29 ENCOUNTER — Encounter: Payer: Self-pay | Admitting: Family Medicine

## 2018-06-29 VITALS — BP 102/60 | HR 68 | Temp 97.4°F | Ht 69.75 in | Wt 119.4 lb

## 2018-06-29 DIAGNOSIS — M25571 Pain in right ankle and joints of right foot: Secondary | ICD-10-CM | POA: Diagnosis not present

## 2018-06-29 DIAGNOSIS — F331 Major depressive disorder, recurrent, moderate: Secondary | ICD-10-CM | POA: Diagnosis not present

## 2018-06-29 DIAGNOSIS — S90812A Abrasion, left foot, initial encounter: Secondary | ICD-10-CM

## 2018-06-29 DIAGNOSIS — M79672 Pain in left foot: Secondary | ICD-10-CM

## 2018-06-29 MED ORDER — FLUOXETINE HCL 20 MG PO TABS: 20.0000 mg | ORAL_TABLET | Freq: Every day | ORAL | 3 refills | Status: AC

## 2018-06-29 NOTE — Patient Instructions (Addendum)
Please return in 1-3 months for your annual complete physical with pap smear; please come fasting.   If you have any questions or concerns, please don't hesitate to send me a message via MyChart or call the office at [phone]. Thank you for visiting with us today! It's our pleasure caring for you.  Glad you are feeling better. Continue taking the prozac.   Please go to our [facility] [place] office to get your xrays done. You can walk in M-F between 8am and 5pm. Tell them you are there for xrays ordered by me. They will send me the results, then I will let you know the results with instructions.   Address: [address], [place], [place] [phone]  (office [address] at [address]; from here, turn left onto [address]), take to [address], turn right and go for a mile or so, office will be on left across form [place] )  Ice your ankle and give it time to heal. Return if pain persists.

## 2018-06-30 ENCOUNTER — Encounter: Payer: Self-pay | Admitting: Family Medicine

## 2018-06-30 NOTE — Progress Notes (Signed)
Subjective  CC:  Chief Complaint  Patient presents with  . Ankle Pain    left ankle pain x 2 days, DOI- 06/27/2018    HPI: Sandra Kemp is a 42 y.o. female who presents to the office today to address the problems listed above in the chief complaint.  Larey SeatFell forward while trying to get off ski lift: pushed forward onto knees; landing on wooden and gravel surface. Since, dorsal ankle pain bilaterally, L>R with walking; mild limp. Abrasion to top of left foot at ankle. Some swelling. No lateral ankle pain or known twisting injury.   Depression f/u: started prozac 8 weeks ago: now doing [facility] better. Feeling well w/o aches or pains like she was having. Mood is better. Coping with life stressors better. No AEs from meds. Due for refill.    Assessment  1. Left foot pain   2. Acute right ankle pain   3. Abrasion of left foot, initial encounter   4. Depression, major, recurrent, moderate ([facility])      Plan   injury:  Check xray to ensure no fracture; if negative, treat as abrasion, contusion with rice and time and wound care.   Depression: now stable. Continue prozac 6-9 months.   Follow up: 3 months for cpe   Orders Placed This Encounter  Procedures  . DG Foot Complete Left   Meds ordered this encounter  Medications  . FLUoxetine (PROZAC) 20 MG tablet    Sig: Take 1 tablet (20 mg total) by mouth daily.    Dispense:  90 tablet    Refill:  3      I reviewed the patients updated PMH, FH, and SocHx.    Patient Active Problem List   Diagnosis Date Noted  . Hx of pyelonephritis 04/28/2018  . ANA positive 04/28/2018  . History of antiphospholipid antibody syndrome 04/28/2018  . Depression, major, recurrent, moderate ([facility]) 04/28/2018   No outpatient medications have been marked as taking for the 06/29/18 encounter (Office Visit) with Willow OraAndy, Biran Mayberry L, MD.    Allergies: Patient has No Known Allergies. Family History: Patient family history includes Alzheimer's disease in her  mother; Autism in her daughter; Diabetes in her father; Healthy in her brother, daughter, son, and son; Hypertension in her father. Social History:  Patient  reports that she has never smoked. She has never used smokeless tobacco. She reports that she does not drink alcohol or use drugs.  Review of Systems: Constitutional: Negative for fever malaise or anorexia Cardiovascular: negative for chest pain Respiratory: negative for SOB or persistent cough Gastrointestinal: negative for abdominal pain  Objective  Vitals: BP 102/60   Pulse 68   Temp (!) 97.4 F (36.3 C)   Ht 5' 9.75" (1.772 m)   Wt 119 lb 6.4 oz (54.2 kg)   SpO2 98%   BMI 17.26 kg/m  Psych: appears happy, nl speech,  Left foot: 3 cm abrasion where top of foot meets ankle. No pus or redness; swelling and tenderness over top of proximal foot; no lateral ligament ttp. Bilateral ankle rom is loose w/o pain.     Commons side effects, risks, benefits, and alternatives for medications and treatment plan prescribed today were discussed, and the patient expressed understanding of the given instructions. Patient is instructed to call or message via MyChart if he/she has any questions or concerns regarding our treatment plan. No barriers to understanding were identified. We discussed Red Flag symptoms and signs in detail. Patient expressed understanding regarding what to do in  case of urgent or emergency type symptoms.   Medication list was reconciled, printed and provided to the patient in AVS. Patient instructions and summary information was reviewed with the patient as documented in the AVS. This note was prepared with assistance of Dragon voice recognition software. Occasional wrong-word or sound-a-like substitutions may have occurred due to the inherent limitations of voice recognition software

## 2019-02-14 IMAGING — CT CT ABD-PELV W/O CM
2 of 4 series · 16 of 46 positions shown, 18 images · non-contrast
Comparison: None.

CLINICAL DATA: 41-year-old female with a history of bilateral flank
pain

EXAM:
CT ABDOMEN AND PELVIS WITHOUT CONTRAST
TECHNIQUE: Multidetector CT imaging of the abdomen and pelvis was performed
following the standard protocol without IV contrast.

[Series 2: axial st · axial · 0.67mm/px · z∈[-456,-80]mm · 13 of 85 slices shown, 15 images]
[im 5/85  soft-tissue]
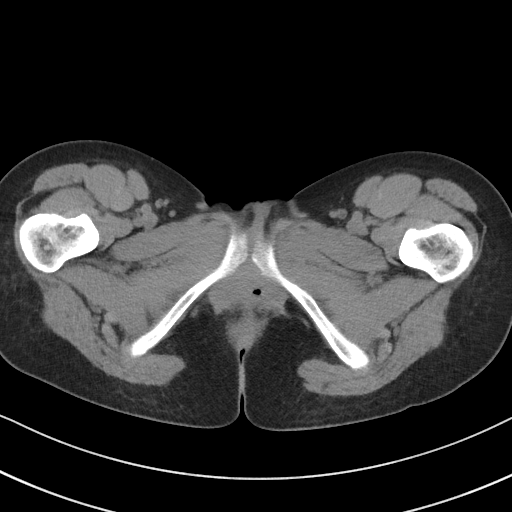
[im 5/85  bone]
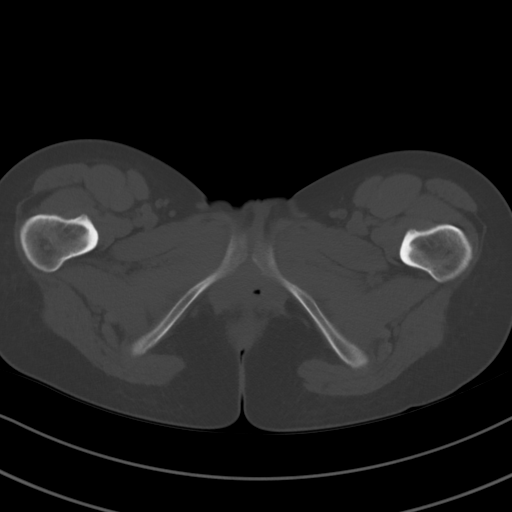
[im 13/85  soft-tissue]
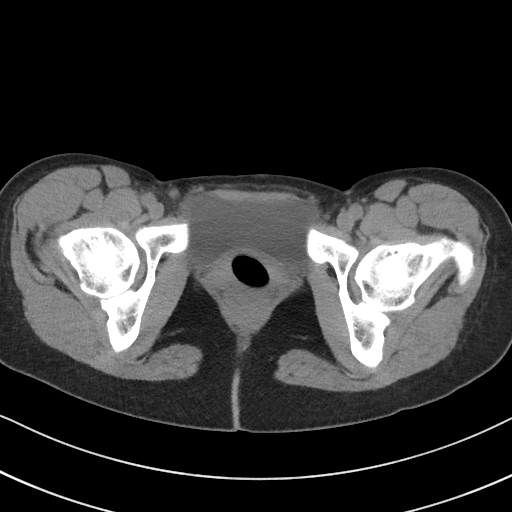
[im 17/85  soft-tissue]
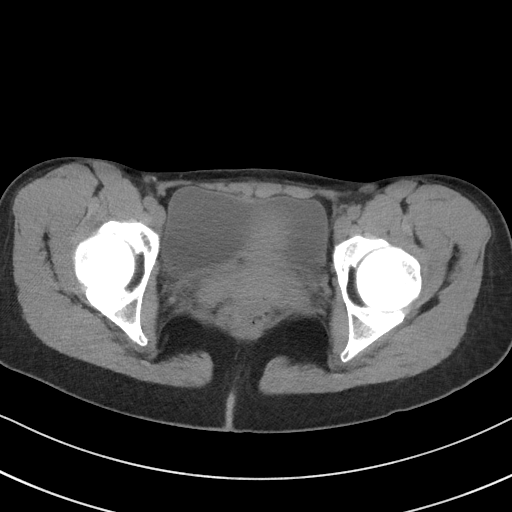
[im 26/85  soft-tissue]
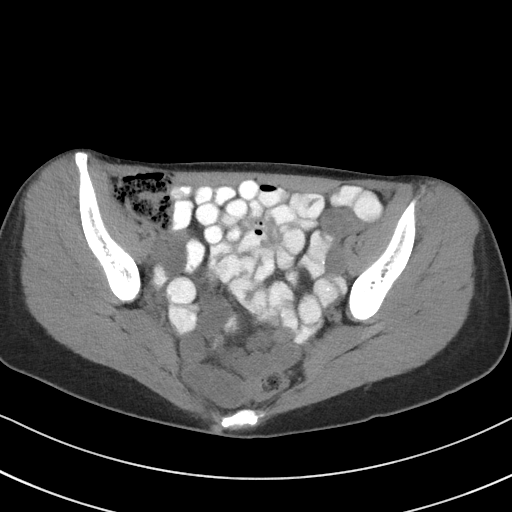
[im 30/85  soft-tissue]
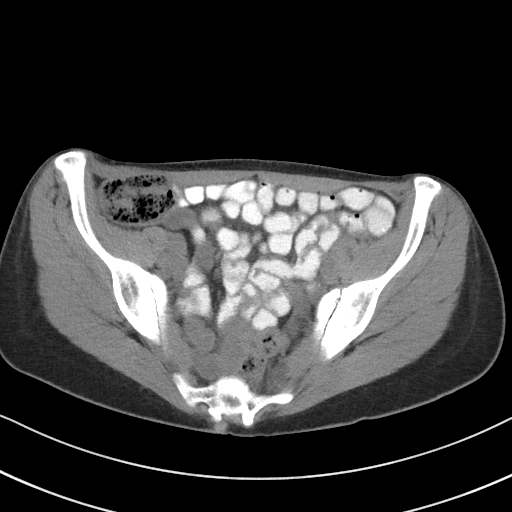
[im 38/85  soft-tissue]
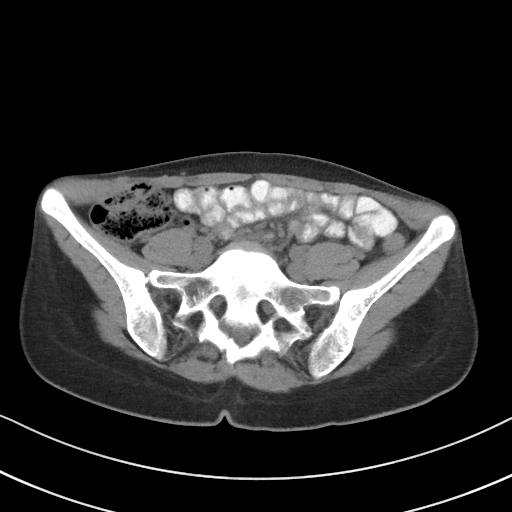
[im 43/85  soft-tissue]
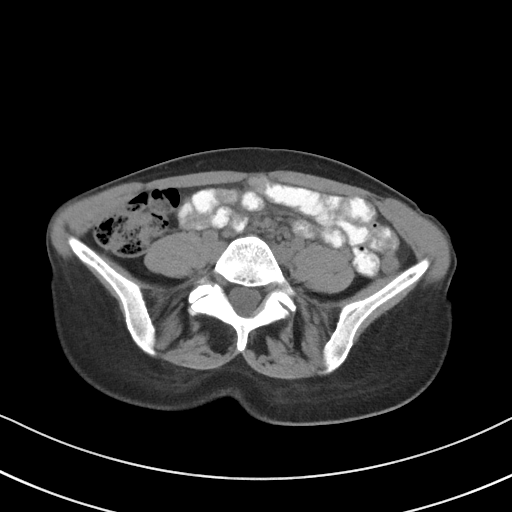
[im 47/85  soft-tissue]
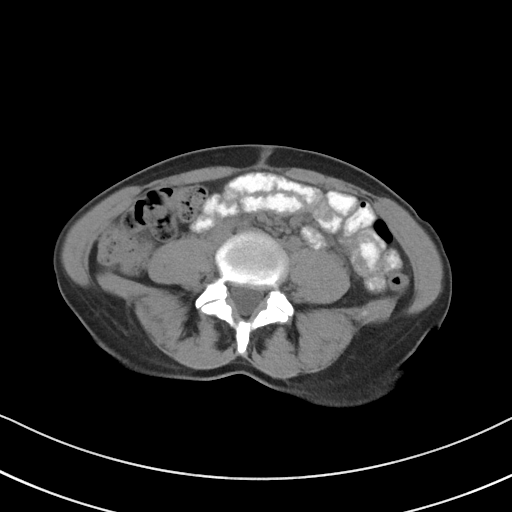
[im 55/85  soft-tissue]
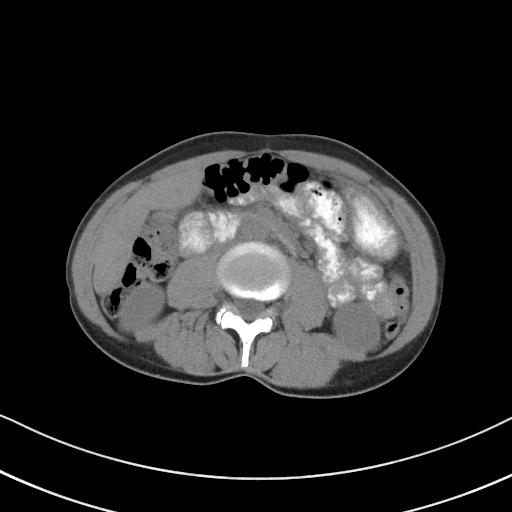
[im 55/85  bone]
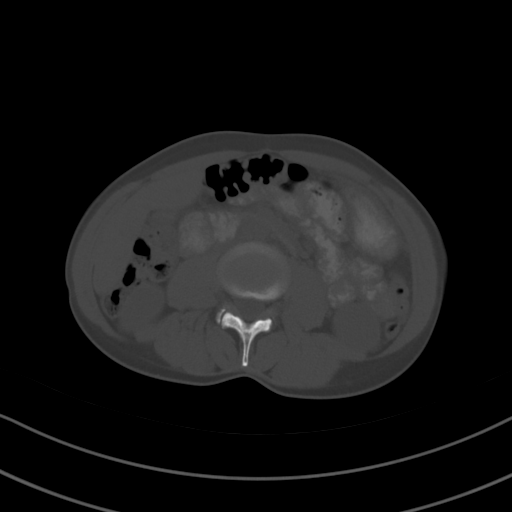
[im 59/85  soft-tissue]
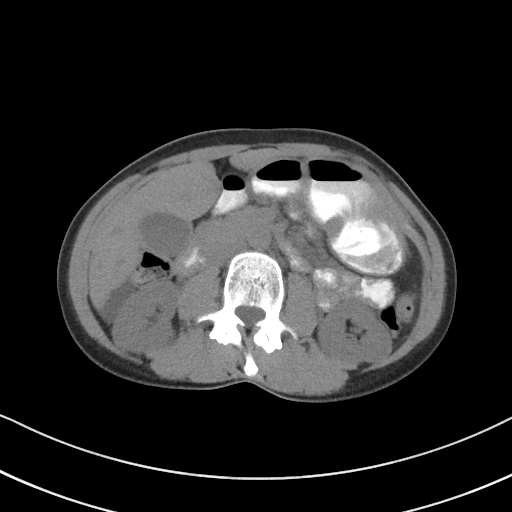
[im 68/85  soft-tissue]
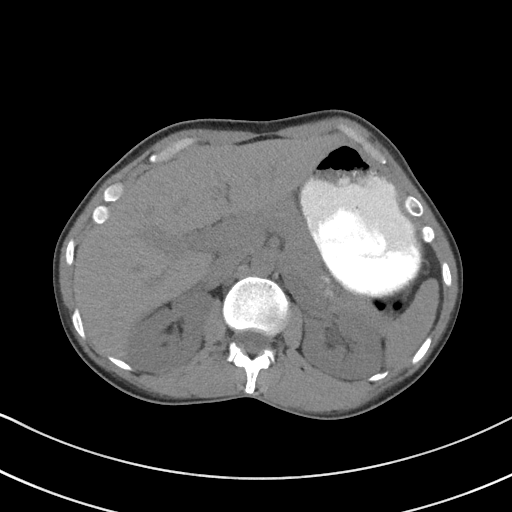
[im 72/85  soft-tissue]
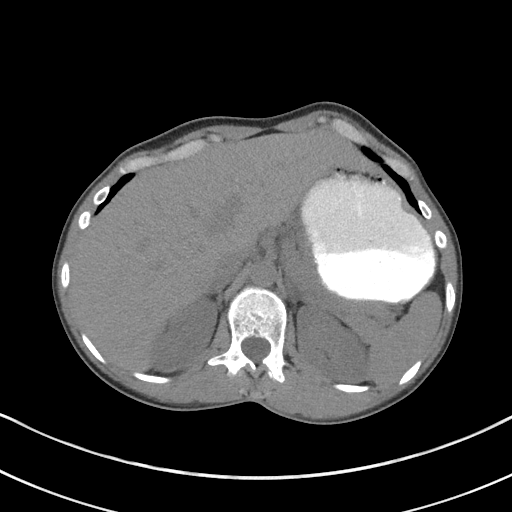
[im 80/85  soft-tissue]
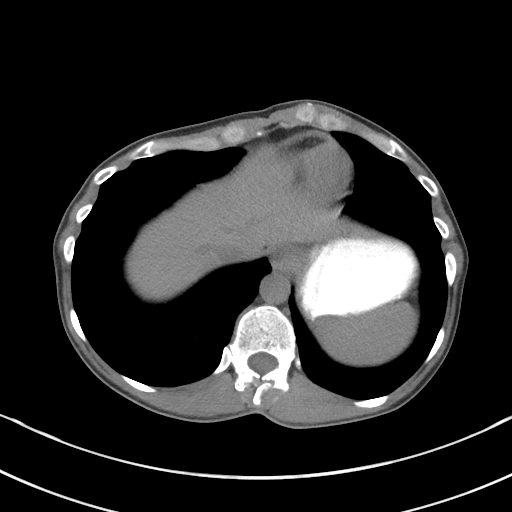

[Series 4: coronal · coronal · 0.78mm/px · 3 of 99 slices shown]
[im 33/99  soft-tissue]
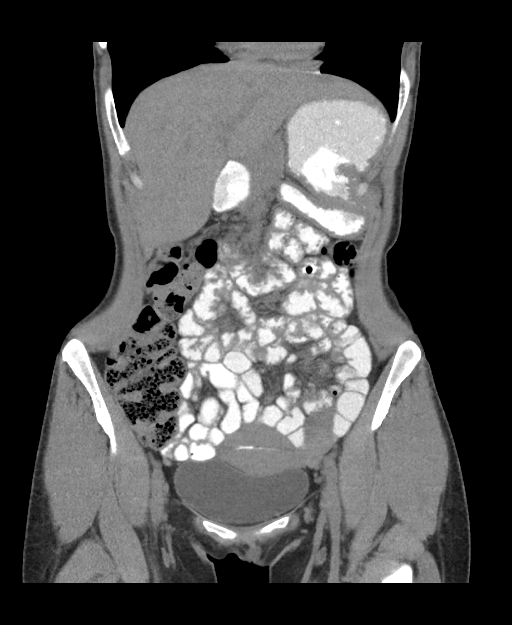
[im 44/99  soft-tissue]
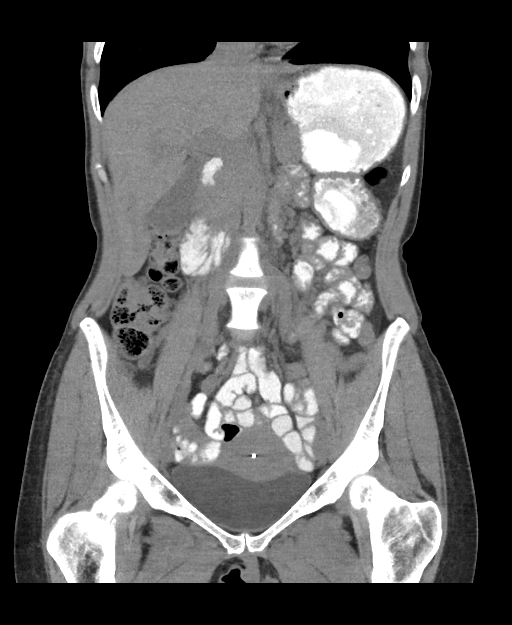
[im 55/99  soft-tissue]
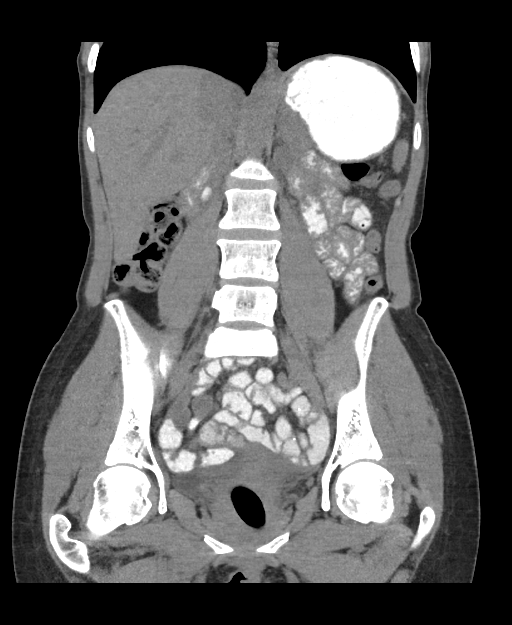

[16 of 46 positions shown; findings below may reference images not displayed]

FINDINGS: Lower chest: No acute abnormality.

Hepatobiliary: No focal liver abnormality is seen. No gallstones,
gallbladder wall thickening, or biliary dilatation.

Pancreas: Unremarkable. No pancreatic ductal dilatation or
surrounding inflammatory changes.

Spleen: Unremarkable spleen

Adrenals/Urinary Tract: Unremarkable appearance of the adrenal
glands. No evidence of hydronephrosis of the right or left kidney.
No nephrolithiasis. Unremarkable course of the bilateral ureters.
Unremarkable appearance of the urinary bladder.

Stomach/Bowel: Unremarkable appearance of stomach, small bowel,
colon. No abnormal distention or transition point. Normal appendix.
Moderate stool burden. No inflammatory changes.

Vascular/Lymphatic: No significant vascular findings are present. No
enlarged abdominal or pelvic lymph nodes.

Reproductive: IUD within the uterus.  Tampon within the vagina.

Other: None

Musculoskeletal: No acute displaced fracture. No significant
degenerative changes.
IMPRESSION: No acute CT finding to account for flank pain. Specifically, no
nephrolithiasis.

IUD in place.

## 2019-11-01 ENCOUNTER — Telehealth: Payer: Self-pay | Admitting: Family Medicine

## 2019-11-01 ENCOUNTER — Ambulatory Visit: Payer: 59 | Admitting: Family Medicine

## 2019-11-01 NOTE — Telephone Encounter (Signed)
Patient is coming in to be seen today. 

## 2019-11-01 NOTE — Telephone Encounter (Signed)
Pt needs to be seen. If severe pain, rec ER since likely needs imaging. If not, then ov here with me or other. TODAY! Thanks.

## 2019-11-01 NOTE — Telephone Encounter (Signed)
Spoke with patient.  States she has pain in RUQ since Friday w/nausea.  No vomiting.  No appetite but is able to eat.  Comes and goes.  Radiates to back and right shoulder.  Abdomen is tender to touch.  Had fever of 100 degrees on Saturday but no longer has fever.  Took 2 Bayer aspirin and that seemed to help somewhat, but did not last long.  Has been constipated but had diarrhea on Sunday night.  Please advise on next steps.

## 2019-11-01 NOTE — Telephone Encounter (Signed)
Patient called and said she has still not gotten a response from a nurse. Please call patient back as soon as possible

## 2019-11-01 NOTE — Telephone Encounter (Signed)
Copied from [place] [id number]. Topic: Appointment Scheduling - Scheduling Inquiry for Clinic >> Nov 01, 2019  8:14 AM Burchel, Abbi R wrote: Reason for CRM: Pt ha had abd pain and fever x 2 days and would like to sched appt with Dr Jonni Sanger today if possible.  Please call pt: [phone].  Please Triage for abd pain, thank you!!

## 2019-11-01 NOTE — Telephone Encounter (Signed)
See below

## 2019-11-04 LAB — HEPATIC FUNCTION PANEL
ALT: 22 (ref 7–35)
AST: 21 (ref 13–35)
Alkaline Phosphatase: 77 (ref 25–125)
Bilirubin, Direct: 0.08 (ref 0.01–0.4)
Bilirubin, Total: 0.2

## 2019-11-04 LAB — BASIC METABOLIC PANEL
BUN: 8 (ref 4–21)
CO2: 22 (ref 13–22)
Chloride: 102 (ref 99–108)
Creatinine: 0.7 (ref 0.5–1.1)
Glucose: 88
Potassium: 3.8 (ref 3.4–5.3)
Sodium: 143 (ref 137–147)

## 2019-11-04 LAB — CBC AND DIFFERENTIAL
HCT: 39 (ref 36–46)
Hemoglobin: 13.6 (ref 12.0–16.0)
Platelets: 433 — AB (ref 150–399)
WBC: 8.2

## 2019-11-04 LAB — COMPREHENSIVE METABOLIC PANEL
Albumin: 5.5 — AB (ref 3.5–5.0)
Calcium: 10.3 (ref 8.7–10.7)
GFR calc non Af Amer: 100

## 2019-11-04 LAB — TSH: TSH: 2.87 (ref 0.41–5.90)

## 2019-11-05 ENCOUNTER — Other Ambulatory Visit: Payer: Self-pay | Admitting: Gastroenterology

## 2019-11-05 ENCOUNTER — Other Ambulatory Visit (HOSPITAL_COMMUNITY): Payer: Self-pay | Admitting: Gastroenterology

## 2019-11-05 DIAGNOSIS — R1011 Right upper quadrant pain: Secondary | ICD-10-CM

## 2019-11-17 ENCOUNTER — Encounter: Payer: Self-pay | Admitting: Family Medicine

## 2019-12-20 ENCOUNTER — Other Ambulatory Visit: Payer: Self-pay | Admitting: Family Medicine

## 2020-04-13 ENCOUNTER — Other Ambulatory Visit (HOSPITAL_BASED_OUTPATIENT_CLINIC_OR_DEPARTMENT_OTHER): Payer: Self-pay | Admitting: Nurse Practitioner

## 2020-04-13 DIAGNOSIS — E041 Nontoxic single thyroid nodule: Secondary | ICD-10-CM

## 2020-04-14 ENCOUNTER — Other Ambulatory Visit: Payer: Self-pay

## 2020-04-14 ENCOUNTER — Ambulatory Visit (HOSPITAL_BASED_OUTPATIENT_CLINIC_OR_DEPARTMENT_OTHER)
Admission: RE | Admit: 2020-04-14 | Discharge: 2020-04-14 | Disposition: A | Discharge status: Home / Self Care | Payer: 59 | Source: Ambulatory Visit | Attending: Nurse Practitioner | Admitting: Nurse Practitioner

## 2020-04-14 DIAGNOSIS — E041 Nontoxic single thyroid nodule: Secondary | ICD-10-CM

## 2021-01-02 ENCOUNTER — Other Ambulatory Visit: Payer: Self-pay | Admitting: Gastroenterology

## 2021-01-02 DIAGNOSIS — R1011 Right upper quadrant pain: Secondary | ICD-10-CM

## 2022-02-08 ENCOUNTER — Other Ambulatory Visit: Payer: Self-pay

## 2022-02-08 ENCOUNTER — Other Ambulatory Visit (HOSPITAL_BASED_OUTPATIENT_CLINIC_OR_DEPARTMENT_OTHER): Payer: Self-pay

## 2022-02-08 ENCOUNTER — Encounter (HOSPITAL_BASED_OUTPATIENT_CLINIC_OR_DEPARTMENT_OTHER): Payer: Self-pay | Admitting: *Deleted

## 2022-02-08 ENCOUNTER — Emergency Department (HOSPITAL_BASED_OUTPATIENT_CLINIC_OR_DEPARTMENT_OTHER)
Admission: EM | Admit: 2022-02-08 | Discharge: 2022-02-08 | Disposition: A | Discharge status: Home / Self Care | Payer: BC Managed Care – PPO | Attending: Emergency Medicine | Admitting: Emergency Medicine

## 2022-02-08 DIAGNOSIS — S0992XA Unspecified injury of nose, initial encounter: Secondary | ICD-10-CM | POA: Diagnosis not present

## 2022-02-08 DIAGNOSIS — W01198A Fall on same level from slipping, tripping and stumbling with subsequent striking against other object, initial encounter: Secondary | ICD-10-CM | POA: Diagnosis not present

## 2022-02-08 MED ORDER — ACETAMINOPHEN 325 MG PO TABS
650.0000 mg | ORAL_TABLET | Freq: Four times a day (QID) | ORAL | 0 refills | Status: AC | PRN
  Filled 2022-02-08: qty 100, 13d supply, fill #0

## 2022-02-08 MED ORDER — PSEUDOEPHEDRINE HCL ER 120 MG PO TB12
120.0000 mg | ORAL_TABLET | Freq: Two times a day (BID) | ORAL | 0 refills | Status: AC
  Filled 2022-02-08: qty 20, 10d supply, fill #0

## 2022-02-08 NOTE — Discharge Instructions (Addendum)
Instructions sinus precautions  DO NOT blow your nose for at least two weeks. DO NOT forcibly spit for one week. DO NOT smoke or use smokeless tobacco; smoking greatly inhibits the healing process, especially in the sinuses. Sneeze with your MOUTH OPEN. If the urge to sneeze arises, do not sneeze through your nose and avoid pinching nostrils. Drink without a straw for one week. Avoid swimming for one month and strenuous exercise (e.g. heavy lifting) for one week. Gentle swishing with salt water may be done for one week, but do not rinse vigorously.

## 2022-02-08 NOTE — ED Triage Notes (Signed)
Pt fell off a friends back and struck her nose on concrete 2 weeks ago. Pt initially had bruising and swelling and now that this is resolved pt continues to have facial pain and feels she may have fractured her nose, difficult to breathe out of left side

## 2022-02-08 NOTE — ED Provider Notes (Signed)
[facility] EMERGENCY DEPT Provider Note   CSN: [id number] Arrival date & time: 02/08/22  7341     History  Chief Complaint  Patient presents with   Facial Injury    Sandra Kemp is a 46 y.o. female.  This is a 46 y.o. female with significant medical history as below, including MTHFR deficiency, antiphospholipid antibody syndrome who presents to the ED with complaint of facial injury.  2 [place] ago [place] the beach, she fell forward from a friend's back landed on her face.  Abrasion to her nose at the time of incident.  No LOC.  No thinners.  No vision or hearing changes.  No neck discomfort.  Patient reports since the swelling has subsided/improved to her nose she is having persistent discomfort to her nose, difficulty breathing from her left naris.  No clear fluid or blood from the nose.  No difficulty swallowing, speaking, no vision or hearing changes.  No pain with neck movement.  Patient does report that she broke her nose many years ago  Patient attempted to be evaluated by ENT but next appt was not available for 3 months so she came to the ER for evaluation.  Past Medical History: No date: AMA (advanced maternal age) multigravida 35+ No date: ANA positive 04/28/2018: History of antiphospholipid antibody syndrome No date: Hx of pyelonephritis No date: Hx of varicella No date: MTHFR deficiency complicating pregnancy (HCC) No date: Vaginal Pap smear, abnormal  Past Surgical History: No date: COLPOSCOPY No date: COSMETIC SURGERY No date: ECTOPIC PREGNANCY SURGERY    The history is provided by the patient and the spouse. No language interpreter was used.  Facial Injury Associated symptoms: no headaches and no nausea       Home Medications Prior to Admission medications   Medication Sig Start Date End Date Taking? Authorizing Provider  acetaminophen (TYLENOL) 325 MG tablet Take 2 tablets (650 mg total) by mouth every 6 (six) hours as needed. 02/08/22   Yes Sloan Leiter, DO  pseudoephedrine (SUDAFED 12 HOUR) 120 MG 12 hr tablet Take 1 tablet (120 mg total) by mouth 2 (two) times daily for 7 days. 02/08/22 02/15/22 Yes Tanda Rockers A, DO  FLUoxetine (PROZAC) 20 MG tablet Take 1 tablet (20 mg total) by mouth daily. 06/29/18   Willow Ora, MD  levonorgestrel (MIRENA, 52 MG,) 20 MCG/24HR IUD 1 each by Intrauterine route once.    [provider]      Allergies    Patient has no known allergies.    Review of Systems   Review of Systems  Constitutional:  Negative for activity change and fever.  HENT:  Positive for sinus pressure and sinus pain. Negative for facial swelling and trouble swallowing.        Nose pain  Eyes:  Negative for discharge and redness.  Respiratory:  Negative for cough and shortness of breath.   Cardiovascular:  Negative for chest pain and palpitations.  Gastrointestinal:  Negative for abdominal pain and nausea.  Genitourinary:  Negative for dysuria and flank pain.  Musculoskeletal:  Negative for back pain and gait problem.  Skin:  Negative for pallor and rash.  Neurological:  Negative for syncope and headaches.   Physical Exam Updated Vital Signs BP 137/87 (BP Location: Right Arm)    Pulse 70    Temp 98.5 F (36.9 C)    Resp 12    LMP 01/26/2022    SpO2 99%  Physical Exam Vitals and nursing note reviewed.  Constitutional:      General: She is not in acute distress.    Appearance: Normal appearance.  HENT:     Head: Normocephalic and atraumatic. No raccoon eyes, Battle's sign or right periorbital erythema.     Jaw: There is normal jaw occlusion. No trismus, swelling or malocclusion.     Comments: Abnormality to nasal bridge, naris are asymmetric, no septal hematoma. Pos septal deviation   No CSF otorrhea or rhinorrhea on exam    Right Ear: External ear normal.     Left Ear: External ear normal.     Nose: Nasal deformity, septal deviation and nasal tenderness present.     Right Nostril: No epistaxis  or septal hematoma.     Left Nostril: No epistaxis or septal hematoma.      Mouth/Throat:     Mouth: Mucous membranes are moist.  Eyes:     General: No scleral icterus.       Right eye: No discharge.        Left eye: No discharge.  Cardiovascular:     Rate and Rhythm: Normal rate and regular rhythm.     Pulses: Normal pulses.     Heart sounds: Normal heart sounds.  Pulmonary:     Effort: Pulmonary effort is normal. No respiratory distress.     Breath sounds: Normal breath sounds.  Abdominal:     General: Abdomen is flat.     Palpations: Abdomen is soft.     Tenderness: There is no abdominal tenderness. There is no guarding or rebound.  Musculoskeletal:        General: Normal range of motion.     Cervical back: Full passive range of motion without pain and normal range of motion.     Right lower leg: No edema.     Left lower leg: No edema.  Skin:    General: Skin is warm and dry.     Capillary Refill: Capillary refill takes less than 2 seconds.  Neurological:     Mental Status: She is alert and oriented to person, place, and time.     GCS: GCS eye subscore is 4. GCS verbal subscore is 5. GCS motor subscore is 6.     Cranial Nerves: Cranial nerves 2-12 are intact.     Sensory: Sensation is intact.     Motor: Motor function is intact.     Coordination: Coordination is intact.     Gait: Gait is intact.  Psychiatric:        Mood and Affect: Mood normal.        Behavior: Behavior normal.    ED Results / Procedures / Treatments   Labs (all labs ordered are listed, but only abnormal results are displayed) Labs Reviewed - No data to display  EKG None  Radiology No results found.  Procedures Procedures    Medications Ordered in ED Medications - No data to display  ED Course/ Medical Decision Making/ A&P                           Medical Decision Making   CC: Nose injury, pos fx  This patient presents to the Emergency Department for the above complaint. This  involves an extensive number of treatment options and is a complaint that carries with it a high risk of complications and morbidity. Vital signs were reviewed. Serious etiologies considered.  Record review:  Previous records obtained and reviewed   Additional history obtained  from spouse  Medical and surgical history as noted above.   Work up as above, notable for:   Social determinants of health include - N/a  Management: Discussed nose blowing precautions, decongestants, analgesics  Reassessment:  Patient w/ likely nasal fracture.  No evidence of septal hematoma.  Low suspicion for acute intracranial injury, skull fracture. Serious etiology was considered. Discussed outpatient ENT follow-up. Given on-call ENT- [facility].   The patient improved significantly and was discharged in stable condition. Detailed discussions were had with the patient regarding current findings, and need for close f/u with PCP or on call doctor. The patient has been instructed to return immediately if the symptoms worsen in any way for re-evaluation. Patient verbalized understanding and is in agreement with current care plan. All questions answered prior to discharge.         This chart was dictated using voice recognition software.  Despite best efforts to proofread,  errors can occur which can change the documentation meaning.         Final Clinical Impression(s) / ED Diagnoses Final diagnoses:  Injury of nose, initial encounter    Rx / DC Orders ED Discharge Orders          Ordered    pseudoephedrine (SUDAFED 12 HOUR) 120 MG 12 hr tablet  2 times daily        02/08/22 1101    acetaminophen (TYLENOL) 325 MG tablet  Every 6 hours PRN        02/08/22 1102              Tanda Rockers A, DO 02/08/22 1105

## 2022-02-21 ENCOUNTER — Other Ambulatory Visit (HOSPITAL_BASED_OUTPATIENT_CLINIC_OR_DEPARTMENT_OTHER): Payer: Self-pay

## 2023-10-29 ENCOUNTER — Emergency Department (HOSPITAL_COMMUNITY): Payer: Self-pay

## 2023-10-29 ENCOUNTER — Inpatient Hospital Stay (HOSPITAL_COMMUNITY)
Admission: EM | Admit: 2023-10-29 | Discharge: 2023-10-30 | DRG: 057 | Disposition: A | Discharge status: Home / Self Care | Payer: BC Managed Care – PPO | Attending: Internal Medicine | Admitting: Internal Medicine

## 2023-10-29 ENCOUNTER — Emergency Department (HOSPITAL_COMMUNITY): Payer: BC Managed Care – PPO

## 2023-10-29 ENCOUNTER — Other Ambulatory Visit: Payer: Self-pay

## 2023-10-29 DIAGNOSIS — T50904A Poisoning by unspecified drugs, medicaments and biological substances, undetermined, initial encounter: Principal | ICD-10-CM

## 2023-10-29 DIAGNOSIS — G9340 Encephalopathy, unspecified: Secondary | ICD-10-CM | POA: Diagnosis present

## 2023-10-29 DIAGNOSIS — D6861 Antiphospholipid syndrome: Secondary | ICD-10-CM | POA: Diagnosis present

## 2023-10-29 DIAGNOSIS — Y908 Blood alcohol level of 240 mg/100 ml or more: Secondary | ICD-10-CM | POA: Diagnosis present

## 2023-10-29 DIAGNOSIS — R680 Hypothermia, not associated with low environmental temperature: Secondary | ICD-10-CM | POA: Diagnosis present

## 2023-10-29 DIAGNOSIS — E872 Acidosis, unspecified: Secondary | ICD-10-CM | POA: Diagnosis present

## 2023-10-29 DIAGNOSIS — I959 Hypotension, unspecified: Secondary | ICD-10-CM | POA: Diagnosis present

## 2023-10-29 DIAGNOSIS — F10129 Alcohol abuse with intoxication, unspecified: Secondary | ICD-10-CM | POA: Diagnosis present

## 2023-10-29 DIAGNOSIS — R4182 Altered mental status, unspecified: Secondary | ICD-10-CM | POA: Diagnosis present

## 2023-10-29 DIAGNOSIS — K3189 Other diseases of stomach and duodenum: Secondary | ICD-10-CM | POA: Diagnosis present

## 2023-10-29 DIAGNOSIS — G312 Degeneration of nervous system due to alcohol: Principal | ICD-10-CM | POA: Diagnosis present

## 2023-10-29 DIAGNOSIS — R0902 Hypoxemia: Secondary | ICD-10-CM | POA: Diagnosis present

## 2023-10-29 LAB — I-STAT CHEM 8, ED
BUN: 4 mg/dL — ABNORMAL LOW (ref 6–20)
Calcium, Ion: 1.12 mmol/L — ABNORMAL LOW (ref 1.15–1.40)
Chloride: 108 mmol/L (ref 98–111)
Creatinine, Ser: 1 mg/dL (ref 0.44–1.00)
Glucose, Bld: 102 mg/dL — ABNORMAL HIGH (ref 70–99)
HCT: 44 % (ref 36.0–46.0)
Hemoglobin: 15 g/dL (ref 12.0–15.0)
Potassium: 3.7 mmol/L (ref 3.5–5.1)
Sodium: 142 mmol/L (ref 135–145)
TCO2: 24 mmol/L (ref 22–32)

## 2023-10-29 LAB — URINALYSIS, ROUTINE W REFLEX MICROSCOPIC
Bilirubin Urine: NEGATIVE
Glucose, UA: NEGATIVE mg/dL
Ketones, ur: NEGATIVE mg/dL
Leukocytes,Ua: NEGATIVE
Nitrite: NEGATIVE
Protein, ur: NEGATIVE mg/dL
Specific Gravity, Urine: 1.008 (ref 1.005–1.030)
pH: 6 (ref 5.0–8.0)

## 2023-10-29 LAB — CBC
HCT: 44.2 % (ref 36.0–46.0)
Hemoglobin: 14.4 g/dL (ref 12.0–15.0)
MCH: 31 pg (ref 26.0–34.0)
MCHC: 32.6 g/dL (ref 30.0–36.0)
MCV: 95.3 fL (ref 80.0–100.0)
Platelets: 382 10*3/uL (ref 150–400)
RBC: 4.64 MIL/uL (ref 3.87–5.11)
RDW: 12.9 % (ref 11.5–15.5)
WBC: 6 10*3/uL (ref 4.0–10.5)
nRBC: 0 % (ref 0.0–0.2)

## 2023-10-29 LAB — I-STAT ARTERIAL BLOOD GAS, ED
Acid-base deficit: 7 mmol/L — ABNORMAL HIGH (ref 0.0–2.0)
Bicarbonate: 18.1 mmol/L — ABNORMAL LOW (ref 20.0–28.0)
Calcium, Ion: 1.19 mmol/L (ref 1.15–1.40)
HCT: 33 % — ABNORMAL LOW (ref 36.0–46.0)
Hemoglobin: 11.2 g/dL — ABNORMAL LOW (ref 12.0–15.0)
O2 Saturation: 100 %
Patient temperature: 95.1
Potassium: 3.8 mmol/L (ref 3.5–5.1)
Sodium: 139 mmol/L (ref 135–145)
TCO2: 19 mmol/L — ABNORMAL LOW (ref 22–32)
pCO2 arterial: 30.6 mmHg — ABNORMAL LOW (ref 32–48)
pH, Arterial: 7.372 (ref 7.35–7.45)
pO2, Arterial: 504 mmHg — ABNORMAL HIGH (ref 83–108)

## 2023-10-29 LAB — RAPID URINE DRUG SCREEN, HOSP PERFORMED
Amphetamines: NOT DETECTED
Barbiturates: NOT DETECTED
Benzodiazepines: NOT DETECTED
Cocaine: NOT DETECTED
Opiates: NOT DETECTED
Tetrahydrocannabinol: POSITIVE — AB

## 2023-10-29 LAB — PROTIME-INR
INR: 1 (ref 0.8–1.2)
Prothrombin Time: 13.3 s (ref 11.4–15.2)

## 2023-10-29 LAB — COMPREHENSIVE METABOLIC PANEL
ALT: 19 U/L (ref 0–44)
AST: 22 U/L (ref 15–41)
Albumin: 4.1 g/dL (ref 3.5–5.0)
Alkaline Phosphatase: 54 U/L (ref 38–126)
Anion gap: 16 — ABNORMAL HIGH (ref 5–15)
BUN: <5 mg/dL — ABNORMAL LOW (ref 6–20)
CO2: 18 mmol/L — ABNORMAL LOW (ref 22–32)
Calcium: 9 mg/dL (ref 8.9–10.3)
Chloride: 108 mmol/L (ref 98–111)
Creatinine, Ser: 0.81 mg/dL (ref 0.44–1.00)
GFR, Estimated: >60 mL/min (ref >60)
Glucose, Bld: 102 mg/dL — ABNORMAL HIGH (ref 70–99)
Potassium: 3.6 mmol/L (ref 3.5–5.1)
Sodium: 142 mmol/L (ref 135–145)
Total Bilirubin: 0.5 mg/dL (ref <1.2)
Total Protein: 6.8 g/dL (ref 6.5–8.1)

## 2023-10-29 LAB — TSH: TSH: 1.049 u[IU]/mL (ref 0.350–4.500)

## 2023-10-29 LAB — GLUCOSE, CAPILLARY: Glucose-Capillary: 94 mg/dL (ref 70–99)

## 2023-10-29 LAB — CBG MONITORING, ED: Glucose-Capillary: 92 mg/dL (ref 70–99)

## 2023-10-29 LAB — SAMPLE TO BLOOD BANK

## 2023-10-29 LAB — I-STAT CG4 LACTIC ACID, ED: Lactic Acid, Venous: 2.5 mmol/L (ref 0.5–1.9)

## 2023-10-29 LAB — ETHANOL: Alcohol, Ethyl (B): 251 mg/dL — ABNORMAL HIGH (ref <10)

## 2023-10-29 MED ORDER — POLYETHYLENE GLYCOL 3350 17 G PO PACK: 17.0000 g | PACK | Freq: Every day | ORAL | Status: DC | PRN

## 2023-10-29 MED ORDER — SODIUM CHLORIDE 0.9 % IV SOLN: 250.0000 mL | INTRAVENOUS | Status: DC

## 2023-10-29 MED ORDER — INSULIN ASPART 100 UNIT/ML IJ SOLN: 2.0000 [IU] | INTRAMUSCULAR | Status: DC

## 2023-10-29 MED ORDER — NOREPINEPHRINE 4 MG/250ML-% IV SOLN
0.0000 ug/min | INTRAVENOUS | Status: DC
  Administered 2023-10-29: 2 ug/min via INTRAVENOUS
  Filled 2023-10-29: qty 250

## 2023-10-29 MED ORDER — FENTANYL BOLUS VIA INFUSION: 50.0000 ug | INTRAVENOUS | Status: DC | PRN

## 2023-10-29 MED ORDER — MIDAZOLAM BOLUS VIA INFUSION: 0.0000 mg | INTRAVENOUS | Status: DC | PRN

## 2023-10-29 MED ORDER — ENOXAPARIN SODIUM 40 MG/0.4ML IJ SOSY
40.0000 mg | PREFILLED_SYRINGE | INTRAMUSCULAR | Status: DC
  Administered 2023-10-29: 40 mg via SUBCUTANEOUS
  Filled 2023-10-29: qty 0.4

## 2023-10-29 MED ORDER — FAMOTIDINE 20 MG PO TABS
20.0000 mg | ORAL_TABLET | Freq: Two times a day (BID) | ORAL | Status: DC
  Administered 2023-10-29: 20 mg
  Filled 2023-10-29: qty 1

## 2023-10-29 MED ORDER — CHLORHEXIDINE GLUCONATE CLOTH 2 % EX PADS: 6.0000 | MEDICATED_PAD | Freq: Every day | CUTANEOUS | Status: DC

## 2023-10-29 MED ORDER — IOHEXOL 350 MG/ML SOLN
75.0000 mL | Freq: Once | INTRAVENOUS | Status: AC | PRN
  Administered 2023-10-29: 75 mL via INTRAVENOUS

## 2023-10-29 MED ORDER — MIDAZOLAM HCL 2 MG/2ML IJ SOLN
1.0000 mg | INTRAMUSCULAR | Status: DC | PRN
  Administered 2023-10-29: 2 mg via INTRAVENOUS
  Filled 2023-10-29: qty 2

## 2023-10-29 MED ORDER — SUCCINYLCHOLINE CHLORIDE 20 MG/ML IJ SOLN
INTRAMUSCULAR | Status: AC | PRN
  Administered 2023-10-29: 100 mg via INTRAVENOUS

## 2023-10-29 MED ORDER — KCL-LACTATED RINGERS-D5W 20 MEQ/L IV SOLN
INTRAVENOUS | Status: DC
  Filled 2023-10-29: qty 1000

## 2023-10-29 MED ORDER — DOCUSATE SODIUM 100 MG PO CAPS: 100.0000 mg | ORAL_CAPSULE | Freq: Two times a day (BID) | ORAL | Status: DC | PRN

## 2023-10-29 MED ORDER — ORAL CARE MOUTH RINSE
15.0000 mL | OROMUCOSAL | Status: DC
  Administered 2023-10-29 – 2023-10-30 (×2): 15 mL via OROMUCOSAL

## 2023-10-29 MED ORDER — FAMOTIDINE 20 MG PO TABS: 20.0000 mg | ORAL_TABLET | Freq: Two times a day (BID) | ORAL | Status: DC

## 2023-10-29 MED ORDER — LACTATED RINGERS IV SOLN
INTRAVENOUS | Status: AC | PRN
  Administered 2023-10-29: 999 mL via INTRAVENOUS

## 2023-10-29 MED ORDER — FENTANYL 2500MCG IN NS 250ML (10MCG/ML) PREMIX INFUSION
50.0000 ug/h | INTRAVENOUS | Status: DC
  Administered 2023-10-29: 50 ug/h via INTRAVENOUS
  Filled 2023-10-29: qty 250

## 2023-10-29 MED ORDER — MIDAZOLAM-SODIUM CHLORIDE 100-0.9 MG/100ML-% IV SOLN
0.0000 mg/h | INTRAVENOUS | Status: DC
  Administered 2023-10-29: 2 mg/h via INTRAVENOUS
  Filled 2023-10-29: qty 100

## 2023-10-29 MED ORDER — ETOMIDATE 2 MG/ML IV SOLN
INTRAVENOUS | Status: AC | PRN
  Administered 2023-10-29: 20 mg via INTRAVENOUS

## 2023-10-29 MED ORDER — ORAL CARE MOUTH RINSE: 15.0000 mL | OROMUCOSAL | Status: DC | PRN

## 2023-10-29 MED ORDER — CHLORHEXIDINE GLUCONATE CLOTH 2 % EX PADS
6.0000 | MEDICATED_PAD | Freq: Every day | CUTANEOUS | Status: DC
  Administered 2023-10-29: 6 via TOPICAL
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
## 2023-10-29 NOTE — Progress Notes (Signed)
Transition of Care [facility]) - CAGE-AID Screening   Patient Details  Name: Sandra Kemp MRN: [id number] Date of Birth: 03-10-1976  [facility] of [facility]) CM/SW Contact:    Leota Sauers, RN Phone Number: 10/29/2023, 10:03 PM   Clinical Narrative:  Patient unable to participate in screening as patient intubated on arrival to hospital.  CAGE-AID Screening: Substance Abuse Screening unable to be completed due to: : Patient unable to participate
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
## 2023-10-29 NOTE — Progress Notes (Signed)
Pt transported from ED [facility] to CT and back with no complications.
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
## 2023-10-29 NOTE — Progress Notes (Signed)
Pt transported from ED [facility] to 4N24 with no complications.

## 2023-10-30 DIAGNOSIS — G9340 Encephalopathy, unspecified: Secondary | ICD-10-CM | POA: Diagnosis not present

## 2023-10-30 LAB — BASIC METABOLIC PANEL
Anion gap: 11 (ref 5–15)
BUN: 5 mg/dL — ABNORMAL LOW (ref 6–20)
CO2: 17 mmol/L — ABNORMAL LOW (ref 22–32)
Calcium: 8.6 mg/dL — ABNORMAL LOW (ref 8.9–10.3)
Chloride: 112 mmol/L — ABNORMAL HIGH (ref 98–111)
Creatinine, Ser: 0.73 mg/dL (ref 0.44–1.00)
GFR, Estimated: >60 mL/min (ref >60)
Glucose, Bld: 298 mg/dL — ABNORMAL HIGH (ref 70–99)
Potassium: 4.9 mmol/L (ref 3.5–5.1)
Sodium: 140 mmol/L (ref 135–145)

## 2023-10-30 LAB — POCT I-STAT 7, (LYTES, BLD GAS, ICA,H+H)
Acid-base deficit: 6 mmol/L — ABNORMAL HIGH (ref 0.0–2.0)
Bicarbonate: 18.5 mmol/L — ABNORMAL LOW (ref 20.0–28.0)
Calcium, Ion: 1.16 mmol/L (ref 1.15–1.40)
HCT: 37 % (ref 36.0–46.0)
Hemoglobin: 12.6 g/dL (ref 12.0–15.0)
O2 Saturation: 95 %
Patient temperature: 37.7
Potassium: 3.2 mmol/L — ABNORMAL LOW (ref 3.5–5.1)
Sodium: 139 mmol/L (ref 135–145)
TCO2: 19 mmol/L — ABNORMAL LOW (ref 22–32)
pCO2 arterial: 32.7 mm[Hg] (ref 32–48)
pH, Arterial: 7.364 (ref 7.35–7.45)
pO2, Arterial: 81 mm[Hg] — ABNORMAL LOW (ref 83–108)

## 2023-10-30 LAB — CBC
HCT: 39.9 % (ref 36.0–46.0)
Hemoglobin: 13.1 g/dL (ref 12.0–15.0)
MCH: 31.3 pg (ref 26.0–34.0)
MCHC: 32.8 g/dL (ref 30.0–36.0)
MCV: 95.5 fL (ref 80.0–100.0)
Platelets: 395 10*3/uL (ref 150–400)
RBC: 4.18 MIL/uL (ref 3.87–5.11)
RDW: 13 % (ref 11.5–15.5)
WBC: 10.2 10*3/uL (ref 4.0–10.5)
nRBC: 0 % (ref 0.0–0.2)

## 2023-10-30 LAB — AMMONIA: Ammonia: <10 umol/L (ref 9–35)

## 2023-10-30 LAB — GLUCOSE, CAPILLARY
Glucose-Capillary: 93 mg/dL (ref 70–99)
Glucose-Capillary: 105 mg/dL — ABNORMAL HIGH (ref 70–99)
Glucose-Capillary: 89 mg/dL (ref 70–99)

## 2023-10-30 LAB — FERRITIN: Ferritin: 93 ng/mL (ref 11–307)

## 2023-10-30 LAB — MRSA NEXT GEN BY PCR, NASAL: MRSA by PCR Next Gen: NOT DETECTED

## 2023-10-30 LAB — PHOSPHORUS: Phosphorus: 3.4 mg/dL (ref 2.5–4.6)

## 2023-10-30 LAB — BLOOD GAS, ARTERIAL
Acid-base deficit: 4.5 mmol/L — ABNORMAL HIGH (ref 0.0–2.0)
Bicarbonate: 19 mmol/L — ABNORMAL LOW (ref 20.0–28.0)
O2 Saturation: 97.3 %
Patient temperature: 37.5
pCO2 arterial: 31 mm[Hg] — ABNORMAL LOW (ref 32–48)
pH, Arterial: 7.4 (ref 7.35–7.45)
pO2, Arterial: 81 mm[Hg] — ABNORMAL LOW (ref 83–108)

## 2023-10-30 LAB — IRON AND TIBC
Iron: 127 ug/dL (ref 28–170)
Saturation Ratios: 47 % — ABNORMAL HIGH (ref 10.4–31.8)
TIBC: 269 ug/dL (ref 250–450)
UIBC: 142 ug/dL

## 2023-10-30 LAB — HIV ANTIBODY (ROUTINE TESTING W REFLEX): HIV Screen 4th Generation wRfx: NONREACTIVE

## 2023-10-30 LAB — MAGNESIUM: Magnesium: 1.9 mg/dL (ref 1.7–2.4)

## 2023-10-30 LAB — T4, FREE: Free T4: 0.97 ng/dL (ref 0.61–1.12)

## 2023-10-30 LAB — FOLATE: Folate: 6.6 ng/mL (ref >5.9)

## 2023-10-30 LAB — VITAMIN B12: Vitamin B-12: 328 pg/mL (ref 180–914)

## 2023-10-30 MED ORDER — PROPOFOL 1000 MG/100ML IV EMUL: 5.0000 ug/kg/min | INTRAVENOUS | Status: DC

## 2023-10-30 MED ORDER — ONDANSETRON HCL 4 MG/2ML IJ SOLN
4.0000 mg | Freq: Four times a day (QID) | INTRAMUSCULAR | Status: DC | PRN
  Administered 2023-10-30: 2 mg via INTRAVENOUS
  Filled 2023-10-30: qty 2

## 2023-10-30 MED ORDER — POTASSIUM CHLORIDE CRYS ER 20 MEQ PO TBCR
40.0000 meq | EXTENDED_RELEASE_TABLET | Freq: Once | ORAL | Status: AC
  Administered 2023-10-30: 40 meq via ORAL
  Filled 2023-10-30: qty 2

## 2023-10-30 MED ORDER — BUPROPION HCL ER (XL) 300 MG PO TB24: 300.0000 mg | ORAL_TABLET | Freq: Every day | ORAL | 1 refills | Status: AC

## 2023-10-30 MED ORDER — CHLORHEXIDINE GLUCONATE CLOTH 2 % EX PADS
6.0000 | MEDICATED_PAD | Freq: Every day | CUTANEOUS | Status: DC
  Administered 2023-10-30: 6 via TOPICAL

## 2023-10-30 MED ORDER — ONDANSETRON 4 MG PO TBDP: 4.0000 mg | ORAL_TABLET | Freq: Three times a day (TID) | ORAL | 0 refills | Status: AC | PRN

## 2023-10-30 NOTE — Progress Notes (Signed)
Co-ox panel due at 2331 and Comprehensive drug panel due at 0035. Patient is a lab draw. Labs have not been collected and phlebotomy was called at 0152 to request labs to be drawn as soon as possible.

## 2023-10-30 NOTE — Progress Notes (Signed)
After extubation, atient progressed well and gradually began a regular diet, but does have n/v. Patient then ambulated in hallway with this RN with no complications, and is appropriate for discharge per Dr. Katrinka Blazing with CCM. Educational resources, belongings, and AVS reviewed with patient with all in agreeance of plan after discharge.  Mammie Russian, RN

## 2023-10-30 NOTE — Discharge Instructions (Signed)
Follow up with PCP within 2 months of discharge Try to avoid alcohol in future; if using regularly, taper off to reduce risk of withdrawal Do not drink and drive

## 2023-10-30 NOTE — Procedures (Signed)
Extubation Procedure Note  Patient Details:   Name: Sandra Kemp DOB: 1976-06-27 MRN: [id number]   Airway Documentation:  Airway 7.5 mm (Active)  Secured at (cm) 22 cm 10/30/23 0321  Measured From Lips 10/30/23 0321  Secured Location Right 10/30/23 0321  Secured By [employer] 10/30/23 0321  Tube Holder Repositioned Yes 10/30/23 0321  Prone position No 10/30/23 0321  Cuff Pressure (cm H2O) Clear OR 27-39 CmH2O 10/30/23 0321  Site Condition Cool;Dry 10/30/23 0321   Vent end date: 10/30/23 Vent end time: 0841   Evaluation  O2 sats: stable throughout Complications: No apparent complications Patient did tolerate procedure well. Bilateral Breath Sounds: Clear, Diminished   Yes Pt extubated per MD order. Positive cuff leak noted [name] V 10/30/2023, 8:42 AM

## 2023-10-30 NOTE — Progress Notes (Signed)
Per patient and patient's daughter, [name] [name]) [name] is not allowed in the patient's room while admitted.

## 2023-10-30 NOTE — TOC Progression Note (Signed)
Transition of Care [facility]) - Progression Note    Patient Details  Name: Jalon Blackwelder MRN: [id number] Date of Birth: Apr 14, 1976  Transition of Care [facility]) CM/SW Contact  Shanautica Forker Reeves Forth, Student-Social Work Phone Number: 10/30/2023, 12:07 PM  Clinical Narrative:    MSW Intern spoke with pt about alcohol use resources and offered pt a resource packet. Pt graciously accepted packet.        Expected Discharge Plan and Services                                               Social Determinants of Health (SDOH) Interventions SDOH Screenings   Tobacco Use: Low Risk  (03/26/2022)   Received from [facility]    Readmission Risk Interventions     No data to display
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
## 2023-10-30 NOTE — Progress Notes (Signed)
   10/30/23 1100  Spiritual Encounters  Type of Visit Initial  Care provided to: Patient  Conversation partners present during encounter Nurse  Referral source Nurse (RN/NT/LPN)  Reason for visit Routine spiritual support  OnCall Visit No   Ch responded to emotional and spiritual support. Patient's daughter and friend was present at bedside. Patient declined visit. No follow-up needed at this time.

## 2023-10-31 LAB — CARBON MONOXIDE, BLOOD (PERFORMED AT REF LAB): Carbon Monoxide, Blood: 3 % (ref 0.0–3.6)
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
